# Patient Record
Sex: Female | Born: 1990 | Race: Black or African American | Hispanic: No | Marital: Single | Smoking: Never smoker
Health system: Southern US, Community
[De-identification: ages and names within clinical notes are randomized; demographics above are authoritative.]

## PROBLEM LIST (undated history)

## (undated) DIAGNOSIS — N289 Disorder of kidney and ureter, unspecified: Secondary | ICD-10-CM

## (undated) DIAGNOSIS — N186 End stage renal disease: Secondary | ICD-10-CM

## (undated) DIAGNOSIS — I10 Essential (primary) hypertension: Secondary | ICD-10-CM

---

## 2014-11-13 HISTORY — PX: DG AV DIALYSIS  SHUNT ACCESS EXIST*L* OR: HXRAD910

## 2015-12-21 ENCOUNTER — Encounter (HOSPITAL_COMMUNITY): Payer: Self-pay | Admitting: Emergency Medicine

## 2015-12-21 ENCOUNTER — Emergency Department (HOSPITAL_COMMUNITY)
Admission: EM | Admit: 2015-12-21 | Discharge: 2015-12-21 | Disposition: A | Discharge status: Home / Self Care | Payer: Medicare Other | Attending: Emergency Medicine | Admitting: Emergency Medicine

## 2015-12-21 DIAGNOSIS — N898 Other specified noninflammatory disorders of vagina: Secondary | ICD-10-CM | POA: Diagnosis not present

## 2015-12-21 DIAGNOSIS — Z3202 Encounter for pregnancy test, result negative: Secondary | ICD-10-CM | POA: Diagnosis not present

## 2015-12-21 HISTORY — DX: Disorder of kidney and ureter, unspecified: N28.9

## 2015-12-21 LAB — URINALYSIS, ROUTINE W REFLEX MICROSCOPIC
Bilirubin Urine: NEGATIVE
GLUCOSE, UA: 100 mg/dL — AB
KETONES UR: NEGATIVE mg/dL
Nitrite: NEGATIVE
PH: 8.5 — AB (ref 5.0–8.0)
PROTEIN: 100 mg/dL — AB
Specific Gravity, Urine: 1.009 (ref 1.005–1.030)

## 2015-12-21 LAB — I-STAT BETA HCG BLOOD, ED (MC, WL, AP ONLY): I-stat hCG, quantitative: <5 m[IU]/mL (ref <5)

## 2015-12-21 LAB — URINE MICROSCOPIC-ADD ON: BACTERIA UA: NONE SEEN

## 2015-12-21 LAB — POC URINE PREG, ED: Preg Test, Ur: NEGATIVE

## 2015-12-21 MED ORDER — FLUCONAZOLE 150 MG PO TABS
150.0000 mg | ORAL_TABLET | Freq: Every day | ORAL | Status: DC
  Administered 2015-12-21: 150 mg via ORAL
  Filled 2015-12-21: qty 1

## 2015-12-21 NOTE — ED Triage Notes (Signed)
Patient is complaining of vaginal discharge for 3 days.  Denies burning when urinating but has some itching.  Denies bloody discharge.  Patient states she tried over the counter medication for discharge but no success.

## 2015-12-21 NOTE — ED Provider Notes (Signed)
[facility] DEPT Provider Note   CSN: [id number] Arrival date & time: 12/21/15  1321     History   Chief Complaint Chief Complaint  Patient presents with  . Possible Pregnancy  . Vaginal Discharge    HPI Tricia Simmons is a 25 y.o. female who presents with vaginal discharge and for request of a pregnancy test. 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 She states her LMP was 10/10. Her period is normally every 26 days. She is not on birth control and has unprotected sex. She took two home pregnancy tests - one was positive and one was negative. Additionally she is having symptoms of vaginal discharge. She reports associated vaginal itching and soreness. It is the exactly the same as prior yeast infections. She tried Monistat OTC with no relief. Denies fever, chills, abdominal pain, N/V/D, dysuria, vaginal bleeding. She does not want to be tested for STDs.  HPI  Past Medical History:  Diagnosis Date  . Renal disorder     There are no active problems to display for this patient.   Past Surgical History:  Procedure Laterality Date  . DG AV DIALYSIS  SHUNT ACCESS EXIST*L* OR Left 11/13/2014    OB History    Gravida Para Term Preterm AB Living   1         1   SAB TAB Ectopic Multiple Live Births                   Home Medications    Prior to Admission medications   Not on File    Family History No family history on file.  Social History Social History  Substance Use Topics  . Smoking status: Never Smoker  . Smokeless tobacco: Never Used  . Alcohol use No     Allergies   Patient has no allergy information on record.   Review of Systems Review of Systems  Constitutional: Negative for chills and fever.  Respiratory: Negative for shortness of breath.   Cardiovascular: Negative for chest pain.  Gastrointestinal: Negative for abdominal pain, nausea and vomiting.  Genitourinary: Positive for menstrual problem and vaginal discharge.  Negative for dysuria, flank pain, frequency, pelvic pain, vaginal bleeding and vaginal pain.  All other systems reviewed and are negative.    Physical Exam Updated Vital Signs BP (!) 168/109 (BP Location: Right Arm)   Pulse 76   Temp 98.1 F (36.7 C) (Oral)   Resp 16   Ht 5\' 5"  (1.651 m)   Wt 74.8 kg   LMP 11/22/2015 Comment: PATIENT IS UNSURE ABOUT PREGNANCY  SpO2 100%   BMI 27.46 kg/m   Physical Exam  Constitutional: She is oriented to person, place, and time. She appears well-developed and well-nourished. No distress.  HENT:  Head: Normocephalic and atraumatic.  Eyes: Conjunctivae are normal. Pupils are equal, round, and reactive to light. Right eye exhibits no discharge. Left eye exhibits no discharge. No scleral icterus.  Neck: Normal range of motion. Neck supple.  Cardiovascular: Normal rate and regular rhythm.  Exam reveals no gallop and no friction rub.   Murmur heard. Soft systolic murmur  Pulmonary/Chest: Effort normal and breath sounds normal. No respiratory distress. She has no wheezes. She has no rales. She exhibits no tenderness.  Abdominal: Soft. Bowel sounds are normal. She exhibits no distension and no mass. There is no tenderness. There is no rebound and no guarding. No hernia.  Genitourinary:  Genitourinary Comments: Pelvic exam: Pt refused  Musculoskeletal:  She exhibits no edema.  Neurological: She is alert and oriented to person, place, and time.  Skin: Skin is warm and dry.  L AV fistula thrill palpated  Psychiatric: She has a normal mood and affect. Her behavior is normal.  Nursing note and vitals reviewed.    ED Treatments / Results  Labs (all labs ordered are listed, but only abnormal results are displayed) Labs Reviewed  URINALYSIS, ROUTINE W REFLEX MICROSCOPIC (NOT AT [facility]) - Abnormal; Notable for the following:       Result Value   APPearance CLOUDY (*)    pH 8.5 (*)    Glucose, UA 100 (*)    Hgb urine dipstick SMALL (*)    Protein, ur  100 (*)    Leukocytes, UA TRACE (*)    All other components within normal limits  URINE MICROSCOPIC-ADD ON - Abnormal; Notable for the following:    Squamous Epithelial / LPF 6-30 (*)    All other components within normal limits  POC URINE PREG, ED  I-STAT BETA HCG BLOOD, ED ([facility], [facility], [facility] ONLY)    EKG  EKG Interpretation None       Radiology No results found.  Procedures Procedures (including critical care time)  Medications Ordered in ED Medications  fluconazole (DIFLUCAN) tablet 150 mg (150 mg Oral Given 12/21/15 1451)     Initial Impression / Assessment and Plan / ED Course  I have reviewed the triage vital signs and the nursing notes.  Pertinent labs & imaging results that were available during my care of the patient were reviewed by me and considered in my medical decision making (see chart for details).  Clinical Course    25 year old female with request for a pregnancy test and vaginal discharge. She is hypertensive - has ESRD and has not taken her meds today. She refuses pelvic exam today. She understands that if her symptoms are not improving she will likely need a pelvic exam for swabs. Urine preg test is neg and I-stat hcg is <5. Patient is NAD, non-toxic, with stable VS. Patient is informed of clinical course, understands medical decision making process, and agrees with plan. Opportunity for questions provided and all questions answered. Return precautions given.   Final Clinical Impressions(s) / ED Diagnoses   Final diagnoses:  Negative pregnancy test  Vaginal discharge    New Prescriptions New Prescriptions   No medications on file     Recardo Evangelist, PA-C 12/21/15 [place], MD 12/22/15 2348

## 2016-02-01 ENCOUNTER — Emergency Department (HOSPITAL_COMMUNITY)
Admission: EM | Admit: 2016-02-01 | Discharge: 2016-02-01 | Disposition: A | Discharge status: Home / Self Care | Payer: Medicare Other | Attending: Emergency Medicine | Admitting: Emergency Medicine

## 2016-02-01 ENCOUNTER — Encounter (HOSPITAL_COMMUNITY): Payer: Self-pay | Admitting: Emergency Medicine

## 2016-02-01 DIAGNOSIS — N898 Other specified noninflammatory disorders of vagina: Secondary | ICD-10-CM | POA: Diagnosis present

## 2016-02-01 MED ORDER — FLUCONAZOLE 150 MG PO TABS: 150.0000 mg | ORAL_TABLET | Freq: Once | ORAL | 0 refills | Status: AC

## 2016-02-01 MED ORDER — FLUCONAZOLE 150 MG PO TABS
150.0000 mg | ORAL_TABLET | Freq: Once | ORAL | Status: AC
  Administered 2016-02-01: 150 mg via ORAL
  Filled 2016-02-01: qty 1

## 2016-02-01 NOTE — ED Provider Notes (Signed)
[facility] DEPT Provider Note   CSN: [id number] Arrival date & time: 02/01/16  2426     History   Chief Complaint Chief Complaint  Patient presents with  . Vaginal Discharge  . Arm Issue    HPI Tricia Simmons is a 25 y.o. female.  Patient presents to the ED with a chief complaint of yeast infection.  She states that she gets recurrent yeast infections.  She reports associated vaginal itching and mild white vaginal discharge.  She reports being in a monogamous relationship, and has no concerns for STD.  She states that the symptoms normally resolve with diflucan, but then return.  She denies any dysuria, abdominal pain, or other related symptoms.  She also states that she has had an ammonia smell from her left armpit.  She denies any rash, mass, or tenderness.   The history is provided by the patient. No language interpreter was used.    Past Medical History:  Diagnosis Date  . Renal disorder     There are no active problems to display for this patient.   Past Surgical History:  Procedure Laterality Date  . DG AV DIALYSIS  SHUNT ACCESS EXIST*L* OR Left 11/13/2014    OB History    Gravida Para Term Preterm AB Living   1         1   SAB TAB Ectopic Multiple Live Births                   Home Medications    Prior to Admission medications   Medication Sig Start Date End Date Taking? Authorizing Provider  amLODipine (NORVASC) 10 MG tablet Take 10 mg by mouth at bedtime. 09/13/15  Yes Historical Provider, MD  calcium acetate (PHOSLO) 667 MG capsule Take 667-1,334 mg by mouth See admin instructions. 2 capsules before a meal and 1 capsule before a snack - up to 9 capsules daily   Yes Historical Provider, MD  labetalol (NORMODYNE) 200 MG tablet Take 200 mg by mouth 2 (two) times daily.   Yes Historical Provider, MD  lisinopril (PRINIVIL,ZESTRIL) 20 MG tablet Take 20 mg by mouth at bedtime. 10/20/15  Yes Historical Provider, MD  SENSIPAR 60 MG tablet Take 60 mg by mouth  daily after supper. 10/21/15  Yes Historical Provider, MD  valACYclovir (VALTREX) 1000 MG tablet Take 1,000 mg by mouth daily. 10/20/15  Yes Historical Provider, MD  fluconazole (DIFLUCAN) 150 MG tablet Take 1 tablet (150 mg total) by mouth once. 02/01/16 02/01/16  Montine Circle, PA-C    Family History No family history on file.  Social History Social History  Substance Use Topics  . Smoking status: Never Smoker  . Smokeless tobacco: Never Used  . Alcohol use No     Allergies   Patient has no known allergies.   Review of Systems Review of Systems  All other systems reviewed and are negative.    Physical Exam Updated Vital Signs BP (!) 152/106 (BP Location: Right Arm)   Pulse 79   Temp 98.1 F (36.7 C) (Oral)   Resp 16   Ht 5\' 5"  (1.651 m)   Wt 74.8 kg   LMP 01/24/2016   SpO2 100%   BMI 27.46 kg/m   Physical Exam  Constitutional: She is oriented to person, place, and time. She appears well-developed and well-nourished.  HENT:  Head: Normocephalic and atraumatic.  Eyes: Conjunctivae and EOM are normal. Pupils are equal, round, and reactive to light.  Neck: Normal range of motion.  Neck supple.  Cardiovascular: Normal rate and regular rhythm.  Exam reveals no gallop and no friction rub.   No murmur heard. AV fistula in left forearm with thrill  Pulmonary/Chest: Effort normal and breath sounds normal. No respiratory distress. She has no wheezes. She has no rales. She exhibits no tenderness.  Abdominal: Soft. Bowel sounds are normal. She exhibits no distension and no mass. There is no tenderness. There is no rebound and no guarding.  Genitourinary:  Genitourinary Comments: Declined pelvic  Musculoskeletal: Normal range of motion. She exhibits no edema or tenderness.  Neurological: She is alert and oriented to person, place, and time.  Skin: Skin is warm and dry.  Normal left axilla  Psychiatric: She has a normal mood and affect. Her behavior is normal. Judgment and  thought content normal.  Nursing note and vitals reviewed.    ED Treatments / Results  Labs (all labs ordered are listed, but only abnormal results are displayed) Labs Reviewed - No data to display  EKG  EKG Interpretation None       Radiology No results found.  Procedures Procedures (including critical care time)  Medications Ordered in ED Medications  fluconazole (DIFLUCAN) tablet 150 mg (not administered)     Initial Impression / Assessment and Plan / ED Course  I have reviewed the triage vital signs and the nursing notes.  Pertinent labs & imaging results that were available during my care of the patient were reviewed by me and considered in my medical decision making (see chart for details).  Clinical Course     Patient with recurrent vaginal itching, she is convinced that it is yeast.  I advised her to have swabs and pelvic exam done today to confirm, but she declined this.    I have advised her to follow-up with OBGYN regarding vaginal itching.  Recommend F/u with PCP regarding ammonia smell intermittently from left axilla.  Final Clinical Impressions(s) / ED Diagnoses   Final diagnoses:  Vaginal itching    New Prescriptions New Prescriptions   FLUCONAZOLE (DIFLUCAN) 150 MG TABLET    Take 1 tablet (150 mg total) by mouth once.     Montine Circle, PA-C 02/01/16 [address], PA-C 02/01/16 1009    Jola Schmidt, MD 02/02/16 [phone]

## 2016-02-01 NOTE — ED Triage Notes (Signed)
Patient reports recurrent yeast infections. Reports white vaginal discharge x2 weeks. Patient reports she takes dialysis. Reports an "ammonia smell to left arm where the fistula is." The ammonia smell has been going on since August.

## 2016-02-03 ENCOUNTER — Ambulatory Visit (HOSPITAL_COMMUNITY)
Admission: EM | Admit: 2016-02-03 | Discharge: 2016-02-03 | Disposition: A | Discharge status: Home / Self Care | Payer: Medicare Other | Attending: Family Medicine | Admitting: Family Medicine

## 2016-02-03 ENCOUNTER — Encounter (HOSPITAL_COMMUNITY): Payer: Self-pay | Admitting: Emergency Medicine

## 2016-02-03 DIAGNOSIS — R21 Rash and other nonspecific skin eruption: Secondary | ICD-10-CM | POA: Diagnosis not present

## 2016-02-03 HISTORY — DX: Essential (primary) hypertension: I10

## 2016-02-03 MED ORDER — CLOTRIMAZOLE-BETAMETHASONE 1-0.05 % EX CREA: TOPICAL_CREAM | CUTANEOUS | 0 refills | Status: DC

## 2016-02-03 NOTE — ED Provider Notes (Signed)
[facility]    CSN: [id number] Arrival date & time: 02/03/16  1021     History   Chief Complaint Chief Complaint  Patient presents with  . Vascular Access Problem    odor in access arm    HPI Tricia Simmons is a 25 y.o. female.   PT reports an "ammonia" odor from left axilla that started in August. PT dialyzes MWF and her access is in left arm. PT has spoken to her nephrologist about the odor, but it has not been pursued. PT is not going to dialyze today, she is going to wait until Sunday. Her nephrologist is aware of the break in dialysis. PT reports she does produce urine. PT is hypertensive and just took her meds thirty minutes ago.      Past Medical History:  Diagnosis Date  . Hypertension   . Renal disorder     There are no active problems to display for this patient.   Past Surgical History:  Procedure Laterality Date  . DG AV DIALYSIS  SHUNT ACCESS EXIST*L* OR Left 11/13/2014    OB History    Gravida Para Term Preterm AB Living   1         1   SAB TAB Ectopic Multiple Live Births                   Home Medications    Prior to Admission medications   Medication Sig Start Date End Date Taking? Authorizing Provider  amLODipine (NORVASC) 10 MG tablet Take 10 mg by mouth at bedtime. 09/13/15   Historical Provider, MD  calcium acetate (PHOSLO) 667 MG capsule Take 667-1,334 mg by mouth See admin instructions. 2 capsules before a meal and 1 capsule before a snack - up to 9 capsules daily    Historical Provider, MD  clotrimazole-betamethasone (LOTRISONE) cream Apply to affected area 2 times daily prn 02/03/16   Robyn Haber, MD  labetalol (NORMODYNE) 200 MG tablet Take 200 mg by mouth 2 (two) times daily.    Historical Provider, MD  lisinopril (PRINIVIL,ZESTRIL) 20 MG tablet Take 20 mg by mouth at bedtime. 10/20/15   Historical Provider, MD  SENSIPAR 60 MG tablet Take 60 mg by mouth daily after supper. 10/21/15   Historical Provider, MD  valACYclovir  (VALTREX) 1000 MG tablet Take 1,000 mg by mouth daily. 10/20/15   Historical Provider, MD    Family History No family history on file.  Social History Social History  Substance Use Topics  . Smoking status: Never Smoker  . Smokeless tobacco: Never Used  . Alcohol use No     Allergies   Patient has no known allergies.   Review of Systems Review of Systems  Constitutional: Negative.   HENT: Negative.   Respiratory: Negative.   Skin: Negative for color change, rash and wound.     Physical Exam Triage Vital Signs ED Triage Vitals  Enc Vitals Group     BP 02/03/16 1105 (!) 152/105     Pulse Rate 02/03/16 1105 71     Resp 02/03/16 1105 16     Temp 02/03/16 1105 97.6 F (36.4 C)     Temp Source 02/03/16 1105 Oral     SpO2 02/03/16 1105 100 %     Weight 02/03/16 1106 164 lb (74.4 kg)     Height 02/03/16 1106 5\' 5"  (1.651 m)     Head Circumference --      Peak Flow --  Pain Score 02/03/16 1107 0     Pain Loc --      Pain Edu? --      Excl. in [place]? --    No data found.   Updated Vital Signs BP (!) 152/105   Pulse 71   Temp 97.6 F (36.4 C) (Oral)   Resp 16   Ht 5\' 5"  (1.651 m)   Wt 164 lb (74.4 kg)   LMP 01/24/2016   SpO2 100%   BMI 27.29 kg/m    Physical Exam  Constitutional: She is oriented to person, place, and time. She appears well-developed and well-nourished.  HENT:  Head: Normocephalic.  Right Ear: External ear normal.  Left Ear: External ear normal.  Mouth/Throat: Oropharynx is clear and moist.  Eyes: Conjunctivae and EOM are normal. Pupils are equal, round, and reactive to light.  Neck: Normal range of motion. Neck supple.  Musculoskeletal: Normal range of motion.  Vascular access on left forearm, thrill palpated  Neurological: She is oriented to person, place, and time.  Skin: Skin is warm and dry.  Patient has some mild, fine papular changes in the left axilla with some mild hyperpigmentation  Psychiatric: She has a normal mood and  affect. Her behavior is normal.  Nursing note and vitals reviewed.    UC Treatments / Results  Labs (all labs ordered are listed, but only abnormal results are displayed) Labs Reviewed - No data to display  EKG  EKG Interpretation None       Radiology No results found.  Procedures Procedures (including critical care time)  Medications Ordered in UC Medications - No data to display   Initial Impression / Assessment and Plan / UC Course  I have reviewed the triage vital signs and the nursing notes.  Pertinent labs & imaging results that were available during my care of the patient were reviewed by me and considered in my medical decision making (see chart for details).  Clinical Course     Final Clinical Impressions(s) / UC Diagnoses   Final diagnoses:  Rash    New Prescriptions New Prescriptions   CLOTRIMAZOLE-BETAMETHASONE (LOTRISONE) CREAM    Apply to affected area 2 times daily prn     Robyn Haber, MD 02/03/16 1120

## 2016-02-03 NOTE — ED Triage Notes (Signed)
PT reports an "ammonia" odor from left arm pit that started in August. PT dialyzes MWF and her access is in left arm. PT has spoken to her nephrologist about the odor, but it has not been pursued. PT is not going to dialyze today, she is going to wait until Sunday. Her nephrologist is aware of the break in dialysis. PT reports she does produce urine. PT is hypertensive and just took her meds thirty minutes ago

## 2016-02-03 NOTE — Discharge Instructions (Signed)
Expect improvement over the next week.

## 2016-04-24 ENCOUNTER — Other Ambulatory Visit: Payer: Self-pay

## 2016-04-24 ENCOUNTER — Telehealth: Payer: Self-pay

## 2016-04-24 NOTE — Telephone Encounter (Signed)
Phone call from the [facility] with request from Dr. Hollie Salk to schedule for Fistulogram left AVF; pt. has low access flow through AVF.  Fistulogram scheduled for 04/26/16 @ 8:30 AM @ [facility].  Will fax instructions to the kidney center for pt.

## 2016-04-26 ENCOUNTER — Encounter (HOSPITAL_COMMUNITY): Payer: Self-pay | Admitting: Vascular Surgery

## 2016-04-26 ENCOUNTER — Encounter (HOSPITAL_COMMUNITY)
Admission: RE | Disposition: A | Discharge status: Home / Self Care | Payer: Self-pay | Source: Ambulatory Visit | Attending: Vascular Surgery

## 2016-04-26 ENCOUNTER — Ambulatory Visit (HOSPITAL_COMMUNITY)
Admission: RE | Admit: 2016-04-26 | Discharge: 2016-04-26 | Disposition: A | Discharge status: Home / Self Care | Payer: Medicare Other | Source: Ambulatory Visit | Attending: Vascular Surgery | Admitting: Vascular Surgery

## 2016-04-26 ENCOUNTER — Telehealth: Payer: Self-pay | Admitting: Vascular Surgery

## 2016-04-26 DIAGNOSIS — N186 End stage renal disease: Secondary | ICD-10-CM | POA: Insufficient documentation

## 2016-04-26 DIAGNOSIS — T82858A Stenosis of vascular prosthetic devices, implants and grafts, initial encounter: Secondary | ICD-10-CM | POA: Insufficient documentation

## 2016-04-26 DIAGNOSIS — Y832 Surgical operation with anastomosis, bypass or graft as the cause of abnormal reaction of the patient, or of later complication, without mention of misadventure at the time of the procedure: Secondary | ICD-10-CM | POA: Diagnosis not present

## 2016-04-26 DIAGNOSIS — I120 Hypertensive chronic kidney disease with stage 5 chronic kidney disease or end stage renal disease: Secondary | ICD-10-CM | POA: Insufficient documentation

## 2016-04-26 DIAGNOSIS — Z992 Dependence on renal dialysis: Secondary | ICD-10-CM

## 2016-04-26 HISTORY — PX: PERIPHERAL VASCULAR BALLOON ANGIOPLASTY: CATH118281

## 2016-04-26 HISTORY — PX: A/V FISTULAGRAM: CATH118298

## 2016-04-26 LAB — POCT I-STAT, CHEM 8
BUN: 33 mg/dL — ABNORMAL HIGH (ref 6–20)
CHLORIDE: 97 mmol/L — AB (ref 101–111)
CREATININE: 7.6 mg/dL — AB (ref 0.44–1.00)
Calcium, Ion: 1.01 mmol/L — ABNORMAL LOW (ref 1.15–1.40)
GLUCOSE: 84 mg/dL (ref 65–99)
HEMATOCRIT: 32 % — AB (ref 36.0–46.0)
HEMOGLOBIN: 10.9 g/dL — AB (ref 12.0–15.0)
Potassium: 5 mmol/L (ref 3.5–5.1)
Sodium: 138 mmol/L (ref 135–145)
TCO2: 34 mmol/L (ref 0–100)

## 2016-04-26 LAB — HCG, SERUM, QUALITATIVE: Preg, Serum: NEGATIVE

## 2016-04-26 SURGERY — A/V FISTULAGRAM
Anesthesia: LOCAL | Laterality: Left

## 2016-04-26 MED ORDER — FENTANYL CITRATE (PF) 100 MCG/2ML IJ SOLN
INTRAMUSCULAR | Status: DC | PRN
  Administered 2016-04-26 (×2): 50 ug via INTRAVENOUS

## 2016-04-26 MED ORDER — SODIUM CHLORIDE 0.9% FLUSH: 3.0000 mL | INTRAVENOUS | Status: DC | PRN

## 2016-04-26 MED ORDER — LIDOCAINE HCL (PF) 1 % IJ SOLN
INTRAMUSCULAR | Status: AC
  Filled 2016-04-26: qty 30

## 2016-04-26 MED ORDER — HEPARIN SODIUM (PORCINE) 1000 UNIT/ML IJ SOLN
INTRAMUSCULAR | Status: AC
  Filled 2016-04-26: qty 1

## 2016-04-26 MED ORDER — ACETAMINOPHEN 325 MG PO TABS: 650.0000 mg | ORAL_TABLET | ORAL | Status: DC | PRN

## 2016-04-26 MED ORDER — HEPARIN (PORCINE) IN NACL 2-0.9 UNIT/ML-% IJ SOLN
INTRAMUSCULAR | Status: DC | PRN
  Administered 2016-04-26: 1000 mL

## 2016-04-26 MED ORDER — FENTANYL CITRATE (PF) 100 MCG/2ML IJ SOLN
INTRAMUSCULAR | Status: AC
  Filled 2016-04-26: qty 2

## 2016-04-26 MED ORDER — MIDAZOLAM HCL 2 MG/2ML IJ SOLN
INTRAMUSCULAR | Status: DC | PRN
  Administered 2016-04-26: 1 mg via INTRAVENOUS

## 2016-04-26 MED ORDER — LIDOCAINE HCL (PF) 1 % IJ SOLN
INTRAMUSCULAR | Status: DC | PRN
  Administered 2016-04-26: 2 mL via SUBCUTANEOUS

## 2016-04-26 MED ORDER — HEPARIN SODIUM (PORCINE) 1000 UNIT/ML IJ SOLN
INTRAMUSCULAR | Status: DC | PRN
  Administered 2016-04-26: 3000 [IU] via INTRAVENOUS

## 2016-04-26 MED ORDER — SODIUM CHLORIDE 0.9 % IV SOLN: 250.0000 mL | INTRAVENOUS | Status: DC | PRN

## 2016-04-26 MED ORDER — IODIXANOL 320 MG/ML IV SOLN
INTRAVENOUS | Status: DC | PRN
  Administered 2016-04-26: 45 mL via INTRAVENOUS

## 2016-04-26 MED ORDER — SODIUM CHLORIDE 0.9% FLUSH: 3.0000 mL | Freq: Two times a day (BID) | INTRAVENOUS | Status: DC

## 2016-04-26 MED ORDER — MIDAZOLAM HCL 2 MG/2ML IJ SOLN
INTRAMUSCULAR | Status: AC
  Filled 2016-04-26: qty 2

## 2016-04-26 SURGICAL SUPPLY — 17 items
BAG SNAP BAND KOVER 36X36 (MISCELLANEOUS) ×2 IMPLANT
BALLN ANGIOSCULPT 6X40 (BALLOONS) ×2
BALLOON ANGIOSCULPT 6X40 (BALLOONS) ×1 IMPLANT
CATH ANGIO 5F BER2 65CM (CATHETERS) ×2 IMPLANT
COVER DOME SNAP 22 D (MISCELLANEOUS) ×2 IMPLANT
COVER PRB 48X5XTLSCP FOLD TPE (BAG) ×1 IMPLANT
COVER PROBE 5X48 (BAG) ×1
KIT ENCORE 26 ADVANTAGE (KITS) ×2 IMPLANT
PROTECTION STATION PRESSURIZED (MISCELLANEOUS) ×2
SHEATH PINNACLE R/O II 6F 4CM (SHEATH) ×2 IMPLANT
STATION PROTECTION PRESSURIZED (MISCELLANEOUS) ×1 IMPLANT
STOPCOCK MORSE 400PSI 3WAY (MISCELLANEOUS) ×2 IMPLANT
TRAY PV CATH (CUSTOM PROCEDURE TRAY) ×2 IMPLANT
TUBING CIL FLEX 10 FLL-RA (TUBING) ×2 IMPLANT
WIRE BENTSON .035X145CM (WIRE) ×2 IMPLANT
WIRE MINI STICK MAX (SHEATH) ×2 IMPLANT
WIRE SPARTACORE .014X300CM (WIRE) ×2 IMPLANT

## 2016-04-26 NOTE — H&P (Signed)
Brief History and Physical  History of Present Illness  Tricia Simmons is a 26 y.o. female who presents with chief complaint: poor flow rates in left radiocephalic arteriovenous fistula.  The patient presents today for L arm fistulogram possible intervention. The patient denies any steal sx.  She notes the flow rates have drop from 900 mL/min.    Past Medical History:  Diagnosis Date  . Hypertension   . Renal disorder     Past Surgical History:  Procedure Laterality Date  . DG AV DIALYSIS  SHUNT ACCESS EXIST*L* OR Left 11/13/2014    Social History   Social History  . Marital status: Single    Spouse name: N/A  . Number of children: N/A  . Years of education: N/A   Occupational History  . Not on file.   Social History Main Topics  . Smoking status: Never Smoker  . Smokeless tobacco: Never Used  . Alcohol use No  . Drug use: No  . Sexual activity: Yes   Other Topics Concern  . Not on file   Social History Narrative  . No narrative on file    No family history on file.  No current facility-administered medications on file prior to encounter.    Current Outpatient Prescriptions on File Prior to Encounter  Medication Sig Dispense Refill  . amLODipine (NORVASC) 10 MG tablet Take 10 mg by mouth at bedtime.  4  . calcium acetate (PHOSLO) 667 MG capsule Take 667-1,334 mg by mouth See admin instructions. 2 capsules before a meal and 1 capsule before a snack - up to 9 capsules daily    . labetalol (NORMODYNE) 200 MG tablet Take 400 mg by mouth 2 (two) times daily.     Marland Kitchen lisinopril (PRINIVIL,ZESTRIL) 20 MG tablet Take 20 mg by mouth at bedtime.  3  . SENSIPAR 60 MG tablet Take 60 mg by mouth daily after supper.  5  . valACYclovir (VALTREX) 1000 MG tablet Take 1,000 mg by mouth daily.  0    No Known Allergies  Review of Systems: no steal sx, ESRD: HD-M/W/F  Physical Examination  Vitals:   04/26/16 0831  BP: 129/78  Pulse: 68  Resp: 18  Temp: 98.2 F (36.8 C)   TempSrc: Oral  Weight: 160 lb 15 oz (73 kg)  Height: 5\' 5"  (1.651 m)    General: A&O x 3, WDWN  Pulmonary: Sym exp, good air movt, good air movement, no audible wheezing  Cardiac: RRR, Nl S1, S2,   Musculoskeletal: M/S 5/5 throughout grossly, Extremities without ischemic changes , palpable thrill in left forearm arteriovenous arteriovenous fistula, incision c/w left radiocephalic arteriovenous fistula, +bruit without significant outflow sounds  Laboratory See [place] is a 26 y.o. 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   The patient is scheduled for: Left arm fistulogram, possible intervention. I discussed with the patient the nature of angiographic procedures, especially the limited patencies of any endovascular intervention.  The patient is aware of that the risks of an angiographic procedure include but are not limited to: bleeding, infection, access site complications, renal failure, embolization, rupture of vessel, dissection, possible need for emergent surgical intervention, possible need for surgical procedures to treat the patient's pathology, and stroke and death.    The patient is aware of the risks and agrees to proceed.  Adele Barthel, MD [facility] Office: [phone] Pager:  [phone]  04/26/2016, 10:10 AM

## 2016-04-26 NOTE — Telephone Encounter (Signed)
Scheduled 08/10/16 @ 11 am.

## 2016-04-26 NOTE — Telephone Encounter (Signed)
-----   Message from Mena Goes, RN sent at 04/26/2016 11:56 AM EDT ----- Regarding: 3 months w/ duplex   ----- Message ----- From: Conrad Cimarron, MD Sent: 04/26/2016  11:51 AM To: [facility] Charge Pool  Marche Hottenstein [id number] 1990-12-07  PROCEDURE: 1.  left radiocephalic arteriovenous fistula cannulation under ultrasound guidance 2.  left arm shuntogram 3.  Venoplasty of cephalic vein (Angiosculpt 6 mm x 40 mm) 4.  Conscious sedation 25 minutes  Follow-up: 3 months  Orders(s) for follow-up: L access duplex

## 2016-04-26 NOTE — Discharge Instructions (Signed)
Fistulogram, Care After °Refer to this sheet in the next few weeks. These instructions provide you with information on caring for yourself after your procedure. Your health care provider may also give you more specific instructions. Your treatment has been planned according to current medical practices, but problems sometimes occur. Call your health care provider if you have any problems or questions after your procedure. °What can I expect after the procedure? °After your procedure, it is typical to have the following: °· A small amount of discomfort in the area where the catheters were placed. °· A small amount of bruising around the fistula. °· Sleepiness and fatigue. °Follow these instructions at home: °· Rest at home for the day following your procedure. °· Do not drive or operate heavy machinery while taking pain medicine. °· Take medicines only as directed by your health care provider. °· Do not take baths, swim, or use a hot tub until your health care provider approves. You may shower 24 hours after the procedure or as directed by your health care provider. °· There are many different ways to close and cover an incision, including stitches, skin glue, and adhesive strips. Follow your health care provider's instructions on: °¨ Incision care. °¨ Bandage (dressing) changes and removal. °¨ Incision closure removal. °· Monitor your dialysis fistula carefully. °Contact a health care provider if: °· You have drainage, redness, swelling, or pain at your catheter site. °· You have a fever. °· You have chills. °Get help right away if: °· You feel weak. °· You have trouble balancing. °· You have trouble moving your arms or legs. °· You have problems with your speech or vision. °· You can no longer feel a vibration or buzz when you put your fingers over your dialysis fistula. °· The limb that was used for the procedure: °¨ Swells. °¨ Is painful. °¨ Is cold. °¨ Is discolored, such as blue or pale white. °This information  is not intended to replace advice given to you by your health care provider. Make sure you discuss any questions you have with your health care provider. °Document Released: 06/15/2013 Document Revised: 07/07/2015 Document Reviewed: 03/20/2013 °Elsevier Interactive Patient Education © 2017 [employer] ° °

## 2016-04-26 NOTE — Op Note (Addendum)
OPERATIVE NOTE   PROCEDURE: 1.  left radiocephalic arteriovenous fistula cannulation under ultrasound guidance 2.  left arm shuntogram 3.  Venoplasty of cephalic vein (Angiosculpt 6 mm x 40 mm) 4.  Conscious sedation 25 minutes   PRE-OPERATIVE DIAGNOSIS: left radiocephalic arteriovenous fistula with known venous stenosis    POST-OPERATIVE DIAGNOSIS: same as above    SURGEON: Adele Barthel, MD   ANESTHESIA: local   ESTIMATED BLOOD LOSS: 50 cc   FINDING(S): 1. Recurrent antecubital segment venous stenosis: 3 mm to 5 mm with venoplasty 2. Residual waist in cephalic vein visible event with burst pressure inflation  Patent left radiocephalic arteriovenous fistula  Widely patent central vein Forearm cephalic vein drains primarily through cubital vein into basilic vein   SPECIMEN(S):  None  CONTRAST: 45 cc  INDICATIONS: Tricia Simmons is a 26 y.o. female who presents with left radiocephalic arteriovenous fistula with poor flow rates.  The patient is scheduled for left arm shuntogram.  The patient is aware of that the risks of an angiographic procedure include but are not limited to: bleeding, infection, access site complications, thrombosis of access, renal failure, embolization, rupture of vessel, dissection, arteriovenous fistula, possible need for emergent surgical intervention, possible need for surgical procedures to treat the patient's pathology, anaphylactic reaction to contrast, and stroke and death.  The patient is aware of the risks of the procedure and elects to proceed forward.   DESCRIPTION: After full informed written consent was obtained, the patient was brought back to the angiography suite and placed supine upon the angiography table.  The patient was connected to monitoring equipment.  The left forearm was prepped and draped in the standard fashion for a percutaneous access intervention.  Under ultrasound guidance, the left radiocephalic arteriovenous fistula was  cannulated with a micropuncture needle.  The microwire was advanced into the fistula and the needle was exchanged for the a microsheath, which was lodged 2 cm into the access.  The wire was removed and the sheath was connected to the IV extension tubing.  Hand injections were completed to image the access from the cannulation site up the right atrium.  Findings are listed above.  Based on the images, this patient will need: attempt at cutting venoplasty of the antecubital vein segment.  A Bentson wire was advanced into the basilic vein and the sheath was exchanged for a short 6-Fr sheath.  The patient was given 3000 units of heparin for some anticoagulation.  The patient was given 1 mg of Versed and 50 mcg of Fentanyl for conscious sedation.  She was already on cardiopulmonary monitoring and her monitoring was completed by both a circulating technician and control room technician per protocol.  Based on the imaging, a 6 mm x 40 mm Angiosculpt angioplasty balloon was selected.  The balloon was centered around the antecubital stenosis and inflated to 10 ATM for 2 minutes.  On completion imaging, a >30% residual stenosis was present.  At this point, the same balloon was replaced and centered on the stenosis .  The balloon was centered around the stenosis and inflated to 14 ATM for 3 minutes.  Even with maximal burst pressure, there remained some waist in the vein.  On completion imaging, a <30% residual stenosis (3 mm to 5 mm)was present.  Based on the completion imaging, no further intervention is necessary.  The wire and balloon were removed from the sheath.  A 4-0 Monocryl purse-string suture was sewn around the sheath.  The sheath was removed while  tying down the suture.  A sterile bandage was applied to the puncture site.  In the event of recurrent stenosis, I do NOT recommend repeat angioplasty, rather I recommend revision of the antecubital segment of this fistula: resection of sclerotic segment and  reanastomosis of forearm cephalic vein to cubital vein.   COMPLICATIONS: none   CONDITION: stable   Adele Barthel, MD, [facility] [place] Office: [phone] Pager: [phone]  04/26/2016 11:44 AM

## 2016-04-27 ENCOUNTER — Other Ambulatory Visit: Payer: Self-pay

## 2016-04-27 DIAGNOSIS — N186 End stage renal disease: Secondary | ICD-10-CM

## 2016-04-27 DIAGNOSIS — Z992 Dependence on renal dialysis: Principal | ICD-10-CM

## 2016-05-23 ENCOUNTER — Encounter (HOSPITAL_COMMUNITY): Payer: Self-pay | Admitting: Emergency Medicine

## 2016-05-23 ENCOUNTER — Ambulatory Visit (HOSPITAL_COMMUNITY)
Admission: EM | Admit: 2016-05-23 | Discharge: 2016-05-23 | Disposition: A | Discharge status: Home / Self Care | Payer: Medicare Other | Attending: Internal Medicine | Admitting: Internal Medicine

## 2016-05-23 DIAGNOSIS — I10 Essential (primary) hypertension: Secondary | ICD-10-CM | POA: Diagnosis not present

## 2016-05-23 DIAGNOSIS — Z3202 Encounter for pregnancy test, result negative: Secondary | ICD-10-CM

## 2016-05-23 DIAGNOSIS — B9689 Other specified bacterial agents as the cause of diseases classified elsewhere: Secondary | ICD-10-CM

## 2016-05-23 DIAGNOSIS — N760 Acute vaginitis: Secondary | ICD-10-CM | POA: Diagnosis not present

## 2016-05-23 DIAGNOSIS — G43909 Migraine, unspecified, not intractable, without status migrainosus: Secondary | ICD-10-CM | POA: Insufficient documentation

## 2016-05-23 DIAGNOSIS — H538 Other visual disturbances: Secondary | ICD-10-CM | POA: Diagnosis present

## 2016-05-23 DIAGNOSIS — G43109 Migraine with aura, not intractable, without status migrainosus: Secondary | ICD-10-CM

## 2016-05-23 LAB — POCT PREGNANCY, URINE: Preg Test, Ur: NEGATIVE

## 2016-05-23 MED ORDER — METRONIDAZOLE 500 MG PO TABS: 500.0000 mg | ORAL_TABLET | Freq: Two times a day (BID) | ORAL | 0 refills | Status: DC

## 2016-05-23 NOTE — ED Provider Notes (Signed)
CSN: [id number]     Arrival date & time 05/23/16  1846 History   None    Chief Complaint  Patient presents with  . Blurred Vision  . SEXUALLY TRANSMITTED DISEASE   (Consider location/radiation/quality/duration/timing/severity/associated sxs/prior Treatment) 26 year old female presents to clinic tonight with 2 chief complaints, the first is blurred vision, onset of 24 hours ago, she denies any eye pain, no eye itching, no eye redness, no discharge, no flashing lights, no sensation of a shade dropping over her eyes. She does not see an eye doctor, does not know what her baseline vision is.   She is also presenting requesting STD checking. States she was seen recently, diagnosed with activity. Vaginosis, and treated with a type of intravaginal suppository. She states that she is having a discharge again, and once to rule out other causes of infection. She denies any pelvic pain, denies any pain with intercourse, denies fever, chills, dysuria, flank pain, or other physical complaints. She is currently on her period, denies possibility of pregnancy, she is sexually active, does not use birth control, and does not use protection.   The history is provided by the patient.    Past Medical History:  Diagnosis Date  . Hypertension   . Renal disorder    Past Surgical History:  Procedure Laterality Date  . A/V SHUNTOGRAM Left 04/26/2016   Procedure: A/V Fistulagram;  Surgeon: Conrad Buffalo, MD;  Location: [facility];  Service: Cardiovascular;  Laterality: Left;  . DG AV DIALYSIS  SHUNT ACCESS EXIST*L* OR Left 11/13/2014  . PERIPHERAL VASCULAR BALLOON ANGIOPLASTY Left 04/26/2016   Procedure: Peripheral Vascular Balloon Angioplasty;  Surgeon: Conrad Millingport, MD;  Location: [facility];  Service: Cardiovascular;  Laterality: Left;  Arm Fistula   History reviewed. No pertinent family history. Social History  Substance Use Topics  . Smoking status: Never Smoker  . Smokeless tobacco: Never  Used  . Alcohol use No   OB History    Gravida Para Term Preterm AB Living   1         1   SAB TAB Ectopic Multiple Live Births                 Review of Systems  Constitutional: Negative for chills and fever.  HENT: Negative for congestion, sinus pain and sinus pressure.   Eyes: Negative for photophobia, pain, discharge, redness, itching and visual disturbance.  Respiratory: Negative for cough and shortness of breath.   Cardiovascular: Negative for chest pain and palpitations.  Gastrointestinal: Negative for diarrhea, nausea and vomiting.  Genitourinary: Positive for vaginal discharge. Negative for dyspareunia, dysuria, flank pain, frequency, menstrual problem, pelvic pain, urgency and vaginal pain.  Musculoskeletal: Negative for back pain, neck pain and neck stiffness.  Skin: Negative for color change and pallor.  Neurological: Negative for dizziness, syncope, facial asymmetry, weakness, light-headedness, numbness and headaches.    Allergies  Patient has no known allergies.  Home Medications   Prior to Admission medications   Medication Sig Start Date End Date Taking? Authorizing Provider  amLODipine (NORVASC) 10 MG tablet Take 10 mg by mouth at bedtime. 09/13/15   Historical Provider, MD  calcium acetate (PHOSLO) 667 MG capsule Take 667-1,334 mg by mouth See admin instructions. 2 capsules before a meal and 1 capsule before a snack - up to 9 capsules daily    Historical Provider, MD  labetalol (NORMODYNE) 200 MG tablet Take 400 mg by mouth 2 (two) times daily.  Historical Provider, MD  lisinopril (PRINIVIL,ZESTRIL) 20 MG tablet Take 20 mg by mouth at bedtime. 10/20/15   Historical Provider, MD  metroNIDAZOLE (FLAGYL) 500 MG tablet Take 1 tablet (500 mg total) by mouth 2 (two) times daily. 05/23/16   Barnet Glasgow, NP  SENSIPAR 60 MG tablet Take 60 mg by mouth daily after supper. 10/21/15   Historical Provider, MD  valACYclovir (VALTREX) 1000 MG tablet Take 1,000 mg by mouth  daily. 10/20/15   Historical Provider, MD   Meds Ordered and Administered this Visit  Medications - No data to display  BP 130/78 (BP Location: Right Arm)   Pulse 76   Temp 99.1 F (37.3 C) (Oral)   Resp 18   SpO2 100%  No data found.   Physical Exam  Constitutional: She is oriented to person, place, and time. She appears well-developed and well-nourished. No distress.  HENT:  Head: Normocephalic and atraumatic.  Right Ear: External ear normal.  Left Ear: External ear normal.  Eyes: Conjunctivae, EOM and lids are normal. Pupils are equal, round, and reactive to light. Right eye exhibits no discharge and no exudate. Left eye exhibits no discharge and no exudate. Right conjunctiva is not injected. Right conjunctiva has no hemorrhage. Left conjunctiva is not injected. Left conjunctiva has no hemorrhage. Right eye exhibits normal extraocular motion and no nystagmus. Left eye exhibits normal extraocular motion and no nystagmus.  Fundoscopic exam:      The right eye shows no exudate and no hemorrhage. The right eye shows red reflex.       The left eye shows no exudate and no hemorrhage. The left eye shows red reflex.  Neck: Normal range of motion.  Cardiovascular: Normal rate and regular rhythm.   Pulmonary/Chest: Effort normal and breath sounds normal.  Abdominal: Soft. Bowel sounds are normal. She exhibits no distension. There is no tenderness. There is no guarding.  Genitourinary: Uterus normal. Pelvic exam was performed with patient supine. There is no tenderness or lesion on the right labia. There is no tenderness or lesion on the left labia. Cervix exhibits discharge. Cervix exhibits no motion tenderness and no friability. Right adnexum displays no mass and no tenderness. Left adnexum displays no mass and no tenderness. No erythema, tenderness or bleeding in the vagina. No foreign body in the vagina. Vaginal discharge found.  Lymphadenopathy:       Right: No inguinal adenopathy present.        Left: No inguinal adenopathy present.  Neurological: She is alert and oriented to person, place, and time.  Skin: Skin is warm and dry. Capillary refill takes less than 2 seconds. She is not diaphoretic.  Psychiatric: She has a normal mood and affect. Her behavior is normal.  Nursing note and vitals reviewed.   Urgent Care Course     Procedures (including critical care time)  Labs Review Labs Reviewed  POCT PREGNANCY, URINE  CERVICOVAGINAL ANCILLARY ONLY    Imaging Review No results found.   Visual Acuity Review  Right Eye Distance: 20/70 (with corrective lenses) Left Eye Distance: 20/100 (with corrective lenses) Bilateral Distance: 20/100 (with corrective lenses)  Right Eye Near:   Left Eye Near:    Bilateral Near:         MDM   1. BV (bacterial vaginosis)   2. Ocular migraine    Prescription for metronidazole sent to the pharmacy to cover for bacterial vaginosis, patient declined treatment for STDs presumptively. We'll notify patient results of her tests 3-5 business days  if positive.  With regard to her eye exam, no immediate threat to life or vision seen on exam, her eyes and peripheral fields examined by confrontation, peripheral vision remains intact. No abnormalities were seen on the exam, possibility for ocular migraine, patient declined treatment tonight. Recommended she follow up with an ophthalmologist in 1-2 days if her symptoms persist, or go to the emergency room at any time her symptoms worsen.     Barnet Glasgow, NP 05/23/16 2052

## 2016-05-23 NOTE — Discharge Instructions (Signed)
With regard to your vaginal discharge, you're being treated for bacterial vaginosis, and you're being tested for gonorrhea, chlamydia, yeast, and Trichomonas. If positive for any of these, you will be notified in 3-5 business days. Follow-up with Public health or return to clinic if your symptoms persist past 7 days, also use a barrier method such as condoms or abstinence for the next 7 days.  For your blurred vision, I do not see any immediate threats to vision, or life, on exam. If symptoms could be consistent with an ocular migraine, or another chronic condition that will need evaluation by ophthalmology. I provided the name of an ophthalmologist, I recommend you contact his office in the morning and set up an appointment for an exam. If at any time your vision worsens between now and when you can see him, return to clinic or go to the ER.

## 2016-05-23 NOTE — ED Triage Notes (Signed)
The patient presented to the [facility] with a complaint of blurred vision bilaterally and also requested a STD check...currently no symptoms.

## 2016-05-24 ENCOUNTER — Encounter (HOSPITAL_COMMUNITY): Payer: Self-pay

## 2016-05-24 ENCOUNTER — Encounter (HOSPITAL_COMMUNITY): Payer: Self-pay | Admitting: Emergency Medicine

## 2016-05-24 ENCOUNTER — Emergency Department (HOSPITAL_COMMUNITY): Payer: Medicare Other

## 2016-05-24 ENCOUNTER — Emergency Department (HOSPITAL_COMMUNITY)
Admission: EM | Admit: 2016-05-24 | Discharge: 2016-05-24 | Disposition: A | Discharge status: Home / Self Care | Payer: Medicare Other | Source: Home / Self Care | Attending: Emergency Medicine | Admitting: Emergency Medicine

## 2016-05-24 ENCOUNTER — Emergency Department (HOSPITAL_COMMUNITY)
Admission: EM | Admit: 2016-05-24 | Discharge: 2016-05-25 | Disposition: A | Discharge status: Home / Self Care | Payer: Medicare Other | Attending: Emergency Medicine | Admitting: Emergency Medicine

## 2016-05-24 DIAGNOSIS — H4710 Unspecified papilledema: Secondary | ICD-10-CM

## 2016-05-24 DIAGNOSIS — Z992 Dependence on renal dialysis: Secondary | ICD-10-CM | POA: Insufficient documentation

## 2016-05-24 DIAGNOSIS — H538 Other visual disturbances: Secondary | ICD-10-CM

## 2016-05-24 DIAGNOSIS — Z79899 Other long term (current) drug therapy: Secondary | ICD-10-CM | POA: Insufficient documentation

## 2016-05-24 DIAGNOSIS — I120 Hypertensive chronic kidney disease with stage 5 chronic kidney disease or end stage renal disease: Secondary | ICD-10-CM | POA: Insufficient documentation

## 2016-05-24 DIAGNOSIS — R930 Abnormal findings on diagnostic imaging of skull and head, not elsewhere classified: Secondary | ICD-10-CM | POA: Diagnosis not present

## 2016-05-24 DIAGNOSIS — N186 End stage renal disease: Secondary | ICD-10-CM | POA: Insufficient documentation

## 2016-05-24 LAB — CBC WITH DIFFERENTIAL/PLATELET
Basophils Absolute: 0 10*3/uL (ref 0.0–0.1)
Basophils Relative: 0 %
EOS ABS: 0.6 10*3/uL (ref 0.0–0.7)
Eosinophils Relative: 9 %
HEMATOCRIT: 33.2 % — AB (ref 36.0–46.0)
Hemoglobin: 10.5 g/dL — ABNORMAL LOW (ref 12.0–15.0)
LYMPHS ABS: 1.8 10*3/uL (ref 0.7–4.0)
Lymphocytes Relative: 28 %
MCH: 31.5 pg (ref 26.0–34.0)
MCHC: 31.6 g/dL (ref 30.0–36.0)
MCV: 99.7 fL (ref 78.0–100.0)
MONO ABS: 0.5 10*3/uL (ref 0.1–1.0)
MONOS PCT: 7 %
NEUTROS PCT: 56 %
Neutro Abs: 3.7 10*3/uL (ref 1.7–7.7)
Platelets: 204 10*3/uL (ref 150–400)
RBC: 3.33 MIL/uL — ABNORMAL LOW (ref 3.87–5.11)
RDW: 17.6 % — ABNORMAL HIGH (ref 11.5–15.5)
WBC: 6.6 10*3/uL (ref 4.0–10.5)

## 2016-05-24 LAB — BASIC METABOLIC PANEL
Anion gap: 10 (ref 5–15)
BUN: 20 mg/dL (ref 6–20)
CHLORIDE: 95 mmol/L — AB (ref 101–111)
CO2: 29 mmol/L (ref 22–32)
CREATININE: 8.79 mg/dL — AB (ref 0.44–1.00)
Calcium: 9.3 mg/dL (ref 8.9–10.3)
GFR calc Af Amer: 7 mL/min — ABNORMAL LOW (ref >60)
GFR calc non Af Amer: 6 mL/min — ABNORMAL LOW (ref >60)
Glucose, Bld: 90 mg/dL (ref 65–99)
Potassium: 4.5 mmol/L (ref 3.5–5.1)
Sodium: 134 mmol/L — ABNORMAL LOW (ref 135–145)

## 2016-05-24 LAB — CSF CELL COUNT WITH DIFFERENTIAL
RBC COUNT CSF: 6 /mm3 — AB
Tube #: 1
WBC CSF: 1 /mm3 (ref 0–5)

## 2016-05-24 MED ORDER — LIDOCAINE HCL 1 % IJ SOLN
INTRAMUSCULAR | Status: AC
  Filled 2016-05-24: qty 20

## 2016-05-24 MED ORDER — LIDOCAINE HCL (PF) 1 % IJ SOLN
30.0000 mL | Freq: Once | INTRAMUSCULAR | Status: AC
  Administered 2016-05-24: 30 mL

## 2016-05-24 MED ORDER — LIDOCAINE HCL (PF) 1 % IJ SOLN
INTRAMUSCULAR | Status: AC
  Administered 2016-05-24: 20 mL via INTRASPINAL
  Filled 2016-05-24: qty 30

## 2016-05-24 NOTE — Discharge Instructions (Signed)
Follow with your eye doctor. Return for any other neurologic symptoms such as difficulty with speech or walking dizziness difficulty swallowing weakness on one side of the body.

## 2016-05-24 NOTE — ED Notes (Signed)
Pt in MRI.

## 2016-05-24 NOTE — ED Notes (Signed)
Patient transported to MRI 

## 2016-05-24 NOTE — ED Provider Notes (Signed)
[facility] DEPT Provider Note   CSN: [id number] Arrival date & time: 05/24/16  0756     History   Chief Complaint Chief Complaint  Patient presents with  . Blurred Vision    HPI Tricia Simmons is a 26 y.o. female.  26 yo F with a chief complaint of blurred vision. This been going on for the past 3 or 4 days. She states that she felt those may be normal in the morning and then as she was playing with her daughter outside and then started having trouble seeing things as clearly is normal. She denies double vision. Denies improvement with closing of one eye or the other. Denies head injury denies headache. Denies neck pain. His fevers or chills. Denies difficulty swallowing unilateral weakness or numbness. Denies difficulty talking.   The history is provided by the patient.  Eye Problem   This is a new problem. The current episode started more than 2 days ago. The problem occurs constantly. The problem has not changed since onset.There is a problem in both eyes. There was no injury mechanism. The pain is at a severity of 0/10. The patient is experiencing no pain. There is no history of trauma to the eye. There is no known exposure to pink eye. She does not wear contacts. Associated symptoms include blurred vision. Pertinent negatives include no eye redness, no nausea and no vomiting. She has tried nothing for the symptoms. The treatment provided no relief.    Past Medical History:  Diagnosis Date  . Hypertension   . Renal disorder     Patient Active Problem List   Diagnosis Date Noted  . ESRD on dialysis [facility]) 04/26/2016    Past Surgical History:  Procedure Laterality Date  . A/V SHUNTOGRAM Left 04/26/2016   Procedure: A/V Fistulagram;  Surgeon: Conrad Alma, MD;  Location: [facility];  Service: Cardiovascular;  Laterality: Left;  . DG AV DIALYSIS  SHUNT ACCESS EXIST*L* OR Left 11/13/2014  . PERIPHERAL VASCULAR BALLOON ANGIOPLASTY Left 04/26/2016   Procedure:  Peripheral Vascular Balloon Angioplasty;  Surgeon: Conrad Smithville, MD;  Location: [facility];  Service: Cardiovascular;  Laterality: Left;  Arm Fistula    OB History    Gravida Para Term Preterm AB Living   1         1   SAB TAB Ectopic Multiple Live Births                   Home Medications    Prior to Admission medications   Medication Sig Start Date End Date Taking? Authorizing Provider  amLODipine (NORVASC) 10 MG tablet Take 10 mg by mouth at bedtime. 09/13/15   Historical Provider, MD  calcium acetate (PHOSLO) 667 MG capsule Take 667-1,334 mg by mouth See admin instructions. 2 capsules before a meal and 1 capsule before a snack - up to 9 capsules daily    Historical Provider, MD  labetalol (NORMODYNE) 200 MG tablet Take 400 mg by mouth 2 (two) times daily.     Historical Provider, MD  lisinopril (PRINIVIL,ZESTRIL) 20 MG tablet Take 20 mg by mouth at bedtime. 10/20/15   Historical Provider, MD  metroNIDAZOLE (FLAGYL) 500 MG tablet Take 1 tablet (500 mg total) by mouth 2 (two) times daily. 05/23/16   Barnet Glasgow, NP  SENSIPAR 60 MG tablet Take 60 mg by mouth daily after supper. 10/21/15   Historical Provider, MD  valACYclovir (VALTREX) 1000 MG tablet Take 1,000 mg by mouth daily.  10/20/15   Historical Provider, MD    Family History History reviewed. No pertinent family history.  Social History Social History  Substance Use Topics  . Smoking status: Never Smoker  . Smokeless tobacco: Never Used  . Alcohol use No     Allergies   Patient has no known allergies.   Review of Systems Review of Systems  Constitutional: Negative for chills and fever.  HENT: Negative for congestion and rhinorrhea.   Eyes: Positive for blurred vision and visual disturbance. Negative for redness.  Respiratory: Negative for shortness of breath and wheezing.   Cardiovascular: Negative for chest pain and palpitations.  Gastrointestinal: Negative for nausea and vomiting.  Genitourinary:  Negative for dysuria and urgency.  Musculoskeletal: Negative for arthralgias and myalgias.  Skin: Negative for pallor and wound.  Neurological: Negative for dizziness and headaches.     Physical Exam Updated Vital Signs BP 119/82 (BP Location: Right Arm)   Pulse 70   Temp 99.2 F (37.3 C) (Oral)   Resp 17   Ht 5\' 5"  (1.651 m)   Wt 160 lb (72.6 kg)   LMP 05/18/2016   SpO2 100%   BMI 26.63 kg/m   Physical Exam  Constitutional: She is oriented to person, place, and time. She appears well-developed and well-nourished. No distress.  HENT:  Head: Normocephalic and atraumatic.  Eyes: EOM are normal. Pupils are equal, round, and reactive to light.  Neck: Normal range of motion. Neck supple.  Cardiovascular: Normal rate and regular rhythm.  Exam reveals no gallop and no friction rub.   No murmur heard. Pulmonary/Chest: Effort normal. She has no wheezes. She has no rales.  Abdominal: Soft. She exhibits no distension. There is no tenderness.  Musculoskeletal: She exhibits no edema or tenderness.  Neurological: She is alert and oriented to person, place, and time. She has normal strength. No cranial nerve deficit or sensory deficit. She displays a negative Romberg sign. Coordination and gait normal. GCS eye subscore is 4. GCS verbal subscore is 5. GCS motor subscore is 6. She displays no Babinski's sign on the right side. She displays no Babinski's sign on the left side.  Reflex Scores:      Tricep reflexes are 2+ on the right side and 2+ on the left side.      Bicep reflexes are 2+ on the right side and 2+ on the left side.      Brachioradialis reflexes are 2+ on the right side and 2+ on the left side.      Patellar reflexes are 2+ on the right side and 2+ on the left side.      Achilles reflexes are 2+ on the right side and 2+ on the left side. Skin: Skin is warm and dry. She is not diaphoretic.  Psychiatric: She has a normal mood and affect. Her behavior is normal.  Nursing note and  vitals reviewed.    ED Treatments / Results  Labs (all labs ordered are listed, but only abnormal results are displayed) Labs Reviewed - No data to display  EKG  EKG Interpretation None       Radiology No results found.  Procedures Procedures (including critical care time)  Medications Ordered in ED Medications - No data to display   Initial Impression / Assessment and Plan / ED Course  I have reviewed the triage vital signs and the nursing notes.  Pertinent labs & imaging results that were available during my care of the patient were reviewed by me and  considered in my medical decision making (see chart for details).     26 yo F With a chief complaint of blurred vision. Patient has had benign neurologic exam otherwise. No visual field deficits. Just had her visual acuity checked yesterday in the office at urgent care. Do not suspect that the patient is having an acute stroke. Suggested that she follow-up with her eye doctor for further evaluation.  10:24 AM:  I have discussed the diagnosis/risks/treatment options with the patient and believe the pt to be eligible for discharge home to follow-up with Optho. We also discussed returning to the ED immediately if new or worsening sx occur. We discussed the sx which are most concerning (e.g., other stroke symptoms) that necessitate immediate return. Medications administered to the patient during their visit and any new prescriptions provided to the patient are listed below.  Medications given during this visit Medications - No data to display   The patient appears reasonably screen and/or stabilized for discharge and I doubt any other medical condition or other [facility] requiring further screening, evaluation, or treatment in the ED at this time prior to discharge.    Final Clinical Impressions(s) / ED Diagnoses   Final diagnoses:  Blurred vision    New Prescriptions New Prescriptions   No medications on file     Deno Etienne, DO 05/24/16 1024

## 2016-05-24 NOTE — Consult Note (Signed)
Neurology Consultation Reason for Consult: Visual changes Referring Physician: Lacinda Axon, B  CC: Visual changes  History is obtained from: Patient  HPI: Tricia Simmons is a 26 y.o. female history of end-stage renal disease due to hypertension who presents with visual changes that she first noticed on Tuesday. She states that since she first noticed it, it seems about the same, no worsening, no improvement. She denies any eye pain or pain with eye movement. She has noticed some "fullness" in her head, but not real headache. She has noticed the sensation for multiple months now, but did not worry too much about it.  Due to the visual changes, she was referred to ophthalmology where a dilated exam was performed with evidence of bilateral papilledema.  She was therefore referred back to the ER for further evaluation.  ROS: A 14 point ROS was performed and is negative except as noted in the HPI.   Past Medical History:  Diagnosis Date  . Hypertension   . Renal disorder      History reviewed. No pertinent family history.   Social History:  reports that she has never smoked. She has never used smokeless tobacco. She reports that she does not drink alcohol or use drugs.   Exam: Current vital signs: BP (!) 137/97   Pulse 70   Temp 98.4 F (36.9 C) (Oral)   Resp 16   LMP 05/18/2016   SpO2 100%  Vital signs in last 24 hours: Temp:  [98.4 F (36.9 C)-99.2 F (37.3 C)] 98.4 F (36.9 C) (04/12 1457) Pulse Rate:  [70-75] 70 (04/12 2015) Resp:  [16-17] 16 (04/12 1457) BP: (119-137)/(82-97) 137/97 (04/12 2015) SpO2:  [100 %] 100 % (04/12 2015) Weight:  [72.6 kg (160 lb)] 72.6 kg (160 lb) (04/12 0800)   Physical Exam  Constitutional: Appears well-developed and well-nourished.  Psych: Affect appropriate to situation Eyes: No scleral injection HENT: No OP obstrucion Head: Normocephalic.  Cardiovascular: Normal rate and regular rhythm.  Respiratory: Effort normal and breath sounds normal  to anterior ascultation GI: Soft.  No distension. There is no tenderness.  Skin: WDI  Neuro: Mental Status: Patient is awake, alert, oriented to person, place, month, year, and situation. Patient is able to give a clear and coherent history. No signs of aphasia or neglect Cranial Nerves: II: Visual Fields are full. Pupils are equal, round, and reactive to light.  Discs with edema III,IV, VI: EOMI without ptosis or diploplia.  V: Facial sensation is symmetric to temperature VII: Facial movement is symmetric.  VIII: hearing is intact to voice X: Uvula elevates symmetrically XI: Shoulder shrug is symmetric. XII: tongue is midline without atrophy or fasciculations.  Motor: Tone is normal. Bulk is normal. 5/5 strength was present in all four extremities.  Sensory: Sensation is symmetric to light touch and temperature in the arms and legs. Deep Tendon Reflexes: 2+ and symmetric in the biceps and patellae.  Plantars: Toes are downgoing bilaterally.  Cerebellar: FNF and HKS are intact bilaterally  I have reviewed labs in epic and the results pertinent to this consultation are: Elevated creatinine  I have reviewed the images obtained: MRI brain- no acute intracranial findings  Impression: 26 year old female with bilateral papilledema and sensation of fullness in her head for several months. I suspect that this represents intracranial hypertension and she will need lumbar puncture as well as ruling out secondary causes with an MR venogram.  Unfortunately, due to her end-stage renal disease I would be hesitant is Acetazolamide, though it may  be an option if absolutely necessary.  I would favor starting with topiramate with close follow-up of visual fields.  Recommendations: 1) lumbar puncture for opening pressure, cells, glucose, protein 2) MR venogram 3) if opening pressure is elevated, would start Topamax 50 mg twice a day for 1 week followed by 75 mg twice a day, followed by 100 mg  twice a day 4) close follow-up with ophthalmology and neurology   Roland Rack, MD [facility] [phone]  If 7pm- 7am, please page neurology on call as listed in [place].

## 2016-05-24 NOTE — ED Triage Notes (Signed)
Pt states she has had blurry vision for two days. No headache, no other symptoms.

## 2016-05-24 NOTE — ED Triage Notes (Signed)
Pt states she has had blurry vision X2 days. Seen here this morning for same. She was referred to opthamology and sent back here to have MRI.

## 2016-05-24 NOTE — Procedures (Signed)
Indication: Papilledema  Risks of the procedure were dicussed with the patient including post-LP headache, bleeding, infection, weakness/numbness of legs(radiculopathy), death.  The patient/patient's proxy agreed and written consent was obtained.   The patient was prepped and draped, and using sterile technique a 20 gauge quinke spinal needle was inserted in the L3-4 and L4-5 space. Despite attempts, bony resistance was repeatedly met and CSF was not obtained.   I still feel that she needs LP for opening pressure.   Roland Rack, MD [facility] [phone]  If 7pm- 7am, please page neurology on call as listed in [place].

## 2016-05-24 NOTE — ED Provider Notes (Signed)
[facility] DEPT Provider Note   CSN: [id number] Arrival date & time: 05/24/16  1430     History   Chief Complaint Chief Complaint  Patient presents with  . Blurred Vision    HPI Tricia Simmons is a 26 y.o. female.  The history is provided by the patient and medical records. No language interpreter was used.   Tricia Simmons is a 26 y.o. female  with a PMH of HTN, renal disorder who presents to the Emergency Department complaining of progressively worsening bilateral blurry vision for 3 days. She was seen in ER earlier this morning where she had no focal neuro deficits or visual field deficits and was referred to the ophthalmologist. Patient states she went straight to the ophthalmologist [facility]) where she had full eye examination. Patient states that they told her she had swelling in the back of her eyes and needed to come back to the emergency department for MRIs. No trauma, redness or foreign body sensation. No headaches, weakness, chest pain, shortness of breath, slurred speech. No medications or treatments prior to arrival. No alleviating or aggravating factors noted.    Past Medical History:  Diagnosis Date  . Hypertension   . Renal disorder     Patient Active Problem List   Diagnosis Date Noted  . ESRD on dialysis [facility]) 04/26/2016    Past Surgical History:  Procedure Laterality Date  . A/V SHUNTOGRAM Left 04/26/2016   Procedure: A/V Fistulagram;  Surgeon: Conrad Du Bois, MD;  Location: [facility];  Service: Cardiovascular;  Laterality: Left;  . DG AV DIALYSIS  SHUNT ACCESS EXIST*L* OR Left 11/13/2014  . PERIPHERAL VASCULAR BALLOON ANGIOPLASTY Left 04/26/2016   Procedure: Peripheral Vascular Balloon Angioplasty;  Surgeon: Conrad Pitkin, MD;  Location: [facility];  Service: Cardiovascular;  Laterality: Left;  Arm Fistula    OB History    Gravida Para Term Preterm AB Living   1         1   SAB TAB Ectopic Multiple Live Births         Home Medications    Prior to Admission medications   Medication Sig Start Date End Date Taking? Authorizing Provider  amLODipine (NORVASC) 10 MG tablet Take 10 mg by mouth at bedtime. 09/13/15  Yes Historical Provider, MD  calcium acetate (PHOSLO) 667 MG capsule Take 667-1,334 mg by mouth See admin instructions. 2 capsules before a meal and 1 capsule before a snack - up to 9 capsules daily   Yes Historical Provider, MD  labetalol (NORMODYNE) 200 MG tablet Take 400 mg by mouth 2 (two) times daily.    Yes Historical Provider, MD  lisinopril (PRINIVIL,ZESTRIL) 20 MG tablet Take 20 mg by mouth at bedtime. 10/20/15  Yes Historical Provider, MD  metroNIDAZOLE (FLAGYL) 500 MG tablet Take 1 tablet (500 mg total) by mouth 2 (two) times daily. 05/23/16  Yes Barnet Glasgow, NP  SENSIPAR 60 MG tablet Take 60 mg by mouth daily after supper. 10/21/15  Yes Historical Provider, MD  valACYclovir (VALTREX) 1000 MG tablet Take 1,000 mg by mouth daily. 10/20/15  Yes Historical Provider, MD  topiramate (TOPAMAX) 25 MG tablet Take 1 tablet (25 mg total) by mouth 2 (two) times daily. Take 2 tablets (50mg  total) by mouth twice daily for one week. Then increase dose to 3 tablets (75mg  total) twice daily for one week. Then increase dose to 4 tablets (100mg  total). 05/25/16   [place], PA-C    Family History History  reviewed. No pertinent family history.  Social History Social History  Substance Use Topics  . Smoking status: Never Smoker  . Smokeless tobacco: Never Used  . Alcohol use No     Allergies   Patient has no known allergies.   Review of Systems Review of Systems  Eyes: Positive for visual disturbance (Blurred vision bilaterally).  All other systems reviewed and are negative.    Physical Exam Updated Vital Signs BP 136/89 (BP Location: Right Arm)   Pulse 85   Temp 98.5 F (36.9 C) (Oral)   Resp 16   LMP 05/18/2016   SpO2 100%   Physical Exam  Constitutional: She is oriented  to person, place, and time. She appears well-developed and well-nourished. No distress.  HENT:  Head: Normocephalic and atraumatic.  Eyes: EOM are normal.  PERRL, size 7 bilaterally: did just have dilated eye exam. Peripheral visual fields grossly normal.  Cardiovascular: Normal rate, regular rhythm and normal heart sounds.   No murmur heard. Pulmonary/Chest: Effort normal and breath sounds normal. No respiratory distress.  Abdominal: Soft. She exhibits no distension. There is no tenderness.  Musculoskeletal: She exhibits no edema.  Neurological: She is alert and oriented to person, place, and time.  Skin: Skin is warm and dry.  Nursing note and vitals reviewed.    ED Treatments / Results  Labs (all labs ordered are listed, but only abnormal results are displayed) Labs Reviewed  CBC WITH DIFFERENTIAL/PLATELET - Abnormal; Notable for the following:       Result Value   RBC 3.33 (*)    Hemoglobin 10.5 (*)    HCT 33.2 (*)    RDW 17.6 (*)    All other components within normal limits  BASIC METABOLIC PANEL - Abnormal; Notable for the following:    Sodium 134 (*)    Chloride 95 (*)    Creatinine, Ser 8.79 (*)    GFR calc non Af Amer 6 (*)    GFR calc Af Amer 7 (*)    All other components within normal limits  PROTEIN, CSF - Abnormal; Notable for the following:    Total  Protein, CSF 12 (*)    All other components within normal limits  CSF CELL COUNT WITH DIFFERENTIAL - Abnormal; Notable for the following:    RBC Count, CSF 6 (*)    All other components within normal limits  CSF CULTURE  GLUCOSE, CSF    EKG  EKG Interpretation None       Radiology Mr Brain Wo Contrast  Result Date: 05/24/2016 CLINICAL DATA:  Blurry vision for 4 days. History of hypertension, end-stage renal disease on dialysis. EXAM: MRI HEAD AND ORBITS WITHOUT CONTRAST TECHNIQUE: Multiplanar, multiecho pulse sequences of the brain and surrounding structures were obtained without intravenous contrast.  Multiplanar, multiecho pulse sequences of the orbits and surrounding structures were obtained including fat saturation techniques, without intravenous contrast administration. COMPARISON:  None. FINDINGS: MRI HEAD FINDINGS BRAIN: No reduced diffusion to suggest acute ischemia or hyperacute demyelination. No susceptibility artifact to suggest hemorrhage. The ventricles and sulci are normal for patient's age. No suspicious parenchymal signal, masses or mass effect. No abnormal extra-axial fluid collections. VASCULAR: Normal major intracranial vascular flow voids present at skull base. SKULL AND UPPER CERVICAL SPINE: No abnormal sellar expansion. No suspicious calvarial bone marrow signal. Craniocervical junction maintained. OTHER: None. MRI ORBITS FINDINGS ORBITS: Ocular globes are intact with normal signal. Lenses are located. Preservation of the orbital fat. Slightly patulous optic nerves please distally with  slight kinking. Normal symmetric appearance of the extraocular muscles. No intra-ocular mass, signal abnormality. Superior ophthalmic veins are not enlarged. VISUALIZED SINUSES: Well-aerated. SOFT TISSUES: Normal. IMPRESSION: Normal noncontrast MRI head. Slightly patulous optic nerve sheath and kinked optic nerves can be normal but also associated with intracranial hypertension without corroborative findings. Electronically Signed   By: Elon Alas M.D.   On: 05/24/2016 18:05   Mr Venogram Head  Result Date: 05/24/2016 CLINICAL DATA:  Visual changes, head fullness. Diagnosed with papilledema. History of hypertension and end-stage renal disease on dialysis. EXAM: MR VENOGRAM the HEAD WITHOUT CONTRAST TECHNIQUE: Angiographic images of the intracranial venous structures were obtained using MRV technique without intravenous contrast. COMPARISON:  MRI of the head May 24, 2016 FINDINGS: Normal flow related enhancement within the superior sagittal sinus, torcula of the Herophili. The RIGHT transverse sinus  is dominant. Thready flow related enhancement through the LEFT transverse sinus and sigmoid sinus, with corresponding normal signal on today's MRI through the segments nose is most compatible with developmental variant. Normal flow related enhancement of the internal cerebral veins. IMPRESSION: No dural venous sinus thrombosis by noncontrast MRV. Electronically Signed   By: Elon Alas M.D.   On: 05/24/2016 22:33   Mr Farley Ly Contrast  Result Date: 05/24/2016 CLINICAL DATA:  Blurry vision for 4 days. History of hypertension, end-stage renal disease on dialysis. EXAM: MRI HEAD AND ORBITS WITHOUT CONTRAST TECHNIQUE: Multiplanar, multiecho pulse sequences of the brain and surrounding structures were obtained without intravenous contrast. Multiplanar, multiecho pulse sequences of the orbits and surrounding structures were obtained including fat saturation techniques, without intravenous contrast administration. COMPARISON:  None. FINDINGS: MRI HEAD FINDINGS BRAIN: No reduced diffusion to suggest acute ischemia or hyperacute demyelination. No susceptibility artifact to suggest hemorrhage. The ventricles and sulci are normal for patient's age. No suspicious parenchymal signal, masses or mass effect. No abnormal extra-axial fluid collections. VASCULAR: Normal major intracranial vascular flow voids present at skull base. SKULL AND UPPER CERVICAL SPINE: No abnormal sellar expansion. No suspicious calvarial bone marrow signal. Craniocervical junction maintained. OTHER: None. MRI ORBITS FINDINGS ORBITS: Ocular globes are intact with normal signal. Lenses are located. Preservation of the orbital fat. Slightly patulous optic nerves please distally with slight kinking. Normal symmetric appearance of the extraocular muscles. No intra-ocular mass, signal abnormality. Superior ophthalmic veins are not enlarged. VISUALIZED SINUSES: Well-aerated. SOFT TISSUES: Normal. IMPRESSION: Normal noncontrast MRI head. Slightly  patulous optic nerve sheath and kinked optic nerves can be normal but also associated with intracranial hypertension without corroborative findings. Electronically Signed   By: Elon Alas M.D.   On: 05/24/2016 18:05   Dg Lumbar Puncture Fluoro Guide  Result Date: 05/24/2016 CLINICAL DATA:  Acute onset of blurred vision. Assess for increased opening pressure. Initial encounter. EXAM: DIAGNOSTIC LUMBAR PUNCTURE UNDER FLUOROSCOPIC GUIDANCE FLUOROSCOPY TIME:  Fluoroscopy Time:  1 minute 24 seconds Number of Acquired Spot Images: 1 PROCEDURE: Informed consent was obtained from the patient prior to the procedure, including potential complications of headache, allergy, and pain. With the patient prone, the lower back was prepped with Betadine. 1% Lidocaine was used for local anesthesia. Lumbar puncture was performed at the L4-L5 level using a 22 gauge needle with return of clear CSF. The opening pressure was approximately 23 cm water, including the length of the needle, and measured prone, though at a 11 degree table tilt. CSF was only produced after tilting the table and with the needle inserted to its hub, due to the patient's habitus and positioning. 8  ml of CSF were obtained for laboratory studies. The patient tolerated the procedure well and there were no apparent complications. IMPRESSION: Opening pressure of approximately 23 cm water noted; given the mild tilt of the table, actual opening pressure was likely slightly lower. This is in the borderline normal range. Electronically Signed   By: Garald Balding M.D.   On: 05/24/2016 23:33    Procedures Procedures (including critical care time)  Medications Ordered in ED Medications  lidocaine (XYLOCAINE) 1 % (with pres) injection (not administered)  lidocaine (PF) (XYLOCAINE) 1 % injection 30 mL (30 mLs Other Given 05/24/16 2054)  lidocaine (PF) (XYLOCAINE) 1 % injection (20 mLs Intraspinal Given 05/24/16 2301)     Initial Impression / Assessment  and Plan / ED Course  I have reviewed the triage vital signs and the nursing notes.  Pertinent labs & imaging results that were available during my care of the patient were reviewed by me and considered in my medical decision making (see chart for details).    Sylvi Rybolt is a 26 y.o. female who presents to ED for blurred vision x 3 days. Seen by ophthalmology and referred to ER for MR.   Neurology, Dr. Paschal Dopp, consulted. Per Dr. Paschal Dopp, obtain MR brain and orbits. Hold on MRV. Admit to medicine and neurology will evaluate.   MRI results reviewed:   IMPRESSION: Normal noncontrast MRI head.  Slightly patulous optic nerve sheath and kinked optic nerves can be normal but also associated with intracranial hypertension without corroborative findings.  7:15 PM - Neurology re-paged regarding MRI results. Dr. Leonel Ramsay at bedside.   8:26 PM - Dr. Leonel Ramsay at bedside performing LP.   9:14 PM - LP unsuccessful, discussed case with radiology to order fluoro guided LP done; MRV pending  MRV: No dural venous sinus thrombosis LP with opening pressure of approx. 23-35. Glucose and wbc wdl. Protein 12.  Discussed results with neurology once again - appreciate assistance. Will start on topamax and have patient follow up with neurology as outpatient. rx given. Also will need to keep appointment with eye doctor - already scheduled for next week with Dr. Valetta Close. Patient understands return precautions and follow up plan. All questions answered.   Final Clinical Impressions(s) / ED Diagnoses   Final diagnoses:  Blurred vision    New Prescriptions Discharge Medication List as of 05/25/2016 12:47 AM    START taking these medications   Details  topiramate (TOPAMAX) 25 MG tablet Take 1 tablet (25 mg total) by mouth 2 (two) times daily. Take 2 tablets (50mg  total) by mouth twice daily for one week. Then increase dose to 3 tablets (75mg  total) twice daily for one week. Then increase dose to 4  tablets (100mg  total)., Starting Fri 4 /13/2018, Print         [employer] [place], PA-C 05/25/16 0104    Nat Christen, MD 05/28/16 [place], MD 05/28/16 1345

## 2016-05-24 NOTE — ED Notes (Signed)
Pts opthalmology office called to state they were sending patient to ER due to bilateral optic nerve swelling, sent here to have MRI of brain and orbits. Pt to arrive POV.

## 2016-05-24 NOTE — ED Notes (Signed)
Dr. Leonel Ramsay attempted LP at bedside but was unsuccessful.

## 2016-05-24 NOTE — ED Notes (Signed)
Pt returned from radiology.

## 2016-05-25 LAB — PROTEIN, CSF: Total  Protein, CSF: 12 mg/dL — ABNORMAL LOW (ref 15–45)

## 2016-05-25 LAB — CERVICOVAGINAL ANCILLARY ONLY
Bacterial vaginitis: POSITIVE — AB
Candida vaginitis: NEGATIVE
Chlamydia: NEGATIVE
NEISSERIA GONORRHEA: NEGATIVE
TRICH (WINDOWPATH): NEGATIVE

## 2016-05-25 LAB — GLUCOSE, CSF: GLUCOSE CSF: 50 mg/dL (ref 40–70)

## 2016-05-25 MED ORDER — TOPIRAMATE 25 MG PO TABS: 25.0000 mg | ORAL_TABLET | Freq: Two times a day (BID) | ORAL | 0 refills | Status: DC

## 2016-05-25 NOTE — Discharge Instructions (Signed)
Keep your scheduled appointment with Dr. Valetta Close (ophthalmology) on 4/19.  Follow up with neurology in the next week or two.  Return to ER for new or worsening symptoms, any additional concerns.

## 2016-05-28 LAB — CSF CULTURE W GRAM STAIN: Culture: NO GROWTH

## 2016-05-28 LAB — CSF CULTURE: SPECIAL REQUESTS: NORMAL

## 2016-06-28 ENCOUNTER — Ambulatory Visit: Payer: Medicare Other | Admitting: Neurology

## 2016-06-28 ENCOUNTER — Telehealth: Payer: Self-pay

## 2016-06-28 NOTE — Telephone Encounter (Signed)
Pt no-showed her new pt appt this afternoon.

## 2016-07-02 ENCOUNTER — Encounter: Payer: Self-pay | Admitting: Neurology

## 2016-07-27 ENCOUNTER — Encounter: Payer: Self-pay | Admitting: Vascular Surgery

## 2016-08-08 NOTE — Progress Notes (Deleted)
Established Dialysis Access   History of Present Illness   Tricia Simmons is a 26 y.o. (12/08/1990) female who presents for re-evaluation for permanent access.  The patient is *** hand dominant.  Previous access procedures have been completed in the left arm.  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  On her prior venoplasty on 04/26/16, I recommended: surgical revision of the fistula with transposition of the proximal radiocephalic arteriovenous fistula   to cubital vein rather reattemptng venoplasty.  The patient has *** had a previous PPM placed.  The patient's PMH, PSH, SH, and FamHx are unchanged from 04/26/16.  Current Outpatient Prescriptions  Medication Sig Dispense Refill  . amLODipine (NORVASC) 10 MG tablet Take 10 mg by mouth at bedtime.  4  . calcium acetate (PHOSLO) 667 MG capsule Take 667-1,334 mg by mouth See admin instructions. 2 capsules before a meal and 1 capsule before a snack - up to 9 capsules daily    . labetalol (NORMODYNE) 200 MG tablet Take 400 mg by mouth 2 (two) times daily.     Marland Kitchen lisinopril (PRINIVIL,ZESTRIL) 20 MG tablet Take 20 mg by mouth at bedtime.  3  . metroNIDAZOLE (FLAGYL) 500 MG tablet Take 1 tablet (500 mg total) by mouth 2 (two) times daily. 14 tablet 0  . SENSIPAR 60 MG tablet Take 60 mg by mouth daily after supper.  5  . topiramate (TOPAMAX) 25 MG tablet Take 1 tablet (25 mg total) by mouth 2 (two) times daily. Take 2 tablets (50mg  total) by mouth twice daily for one week. Then increase dose to 3 tablets (75mg  total) twice daily for one week. Then increase dose to 4 tablets (100mg  total). 130 tablet 0  . valACYclovir (VALTREX) 1000 MG tablet Take 1,000 mg by mouth daily.  0   No current facility-administered medications for this visit.     On ROS today: ***, ***   Physical Examination  ***There were no vitals filed for this visit. ***There is no height or weight on file to calculate  BMI.  General {LOC:19197::"Somulent","Alert"}, {Orientation:19197::"Confused","O x 3"}, {Weight:19197::"Obese","Cachectic","WD"}, {General state of health:19197::"Ill appearing","Elderly","NAD"}  Pulmonary {Chest wall:19197::"Asx chest movement","Sym exp"}, {Air movt:19197::"Decreased *** air movt","good B air movt"}, {BS:19197::"rales on ***","rhonchi on ***","wheezing on ***","CTA B"}  Cardiac {Rhythm:19197::"Irregularly, irregular rate and rhythm","RRR, Nl S1, S2"}, {Murmur:19197::"Murmur present: ***","no Murmurs"}, {Rubs:19197::"Rub present: ***","No rubs"}, {Gallop:19197::"Gallop present: ***","No S3,S4"}  Vascular Vessel Right Left  Radial {Palpable:19197::"Not palpable","Faintly palpable","Palpable"} {Palpable:19197::"Not palpable","Faintly palpable","Palpable"}  Brachial {Palpable:19197::"Not palpable","Faintly palpable","Palpable"} {Palpable:19197::"Not palpable","Faintly palpable","Palpable"}  Ulnar {Palpable:19197::"Not palpable","Faintly palpable","Palpable"} {Palpable:19197::"Not palpable","Faintly palpable","Palpable"}    Musculo- skeletal M/S 5/5 throughout {MS:19197::"except ***"," "}, Extremities without ischemic changes {MS:19197::"except ***"," "}  Neurologic Pain and light touch intact in extremities{CN:19197::" except for decreased sensation in ***"," "}, Motor exam as listed above    Medical Decision Making   Tricia Simmons is a 26 y.o. female who presents with ESRD requiring hemodialysis.    ***  I had an extensive discussion with this patient in regards to the nature of access surgery, including risk, benefits, and alternatives.    The patient is aware that the risks of access surgery include but are not limited to: bleeding, infection, steal syndrome, nerve damage, ischemic monomelic neuropathy, failure of access to mature, and possible need for additional access procedures in the future.  ***The patient is aware that I construct transposition procedures in two  stages, requiring a separate operation to complete the procedure.  The patient has *** agreed to  proceed with the above procedure which will be scheduled ***.   Adele Barthel, MD, FACS [facility] Office: [phone] Pager: [phone]

## 2016-08-10 ENCOUNTER — Encounter (HOSPITAL_COMMUNITY): Payer: Medicare Other

## 2016-08-10 ENCOUNTER — Ambulatory Visit: Payer: Medicare Other | Admitting: Vascular Surgery

## 2016-11-18 ENCOUNTER — Ambulatory Visit (HOSPITAL_COMMUNITY)
Admission: EM | Admit: 2016-11-18 | Discharge: 2016-11-18 | Disposition: A | Discharge status: Home / Self Care | Payer: Medicare Other | Attending: Urgent Care | Admitting: Urgent Care

## 2016-11-18 ENCOUNTER — Encounter (HOSPITAL_COMMUNITY): Payer: Self-pay | Admitting: Family Medicine

## 2016-11-18 DIAGNOSIS — Z992 Dependence on renal dialysis: Secondary | ICD-10-CM

## 2016-11-18 DIAGNOSIS — B373 Candidiasis of vulva and vagina: Secondary | ICD-10-CM | POA: Diagnosis not present

## 2016-11-18 DIAGNOSIS — B3731 Acute candidiasis of vulva and vagina: Secondary | ICD-10-CM

## 2016-11-18 DIAGNOSIS — I10 Essential (primary) hypertension: Secondary | ICD-10-CM

## 2016-11-18 DIAGNOSIS — N186 End stage renal disease: Secondary | ICD-10-CM

## 2016-11-18 MED ORDER — FLUCONAZOLE 150 MG PO TABS: 150.0000 mg | ORAL_TABLET | Freq: Once | ORAL | 0 refills | Status: AC

## 2016-11-18 NOTE — ED Provider Notes (Signed)
  MRN: [id number] DOB: 28-Apr-1990  Subjective:   Tricia Simmons is a 26 y.o. female presenting for chief complaint of Vaginitis  Reports 6 day history of vaginal itching, irritation. Has a history of yeast infections. She is on dialysis. Denies genital rash, dysuria, urinary frequency, hematuria, fever, n/v, abdominal pain, pelvic pain. Has a history of genital herpes but denies an outbreak.  Tricia Simmons has No Known Allergies.  Tricia Simmons  has a past medical history of Hypertension and Renal disorder. Also  has a past surgical history that includes DG AV DIALYSIS  SHUNT ACCESS EXIST*L* OR (Left, 11/13/2014); A/V Fistulagram (Left, 04/26/2016); and PERIPHERAL VASCULAR BALLOON ANGIOPLASTY (Left, 04/26/2016).  Objective:   Vitals: BP (!) 149/93   Pulse 73   Temp 98.5 F (36.9 C)   Resp 18   SpO2 100%   Physical Exam  Constitutional: She is oriented to person, place, and time. She appears well-developed and well-nourished.  Cardiovascular: Normal rate.   Pulmonary/Chest: Effort normal.  Neurological: She is alert and oriented to person, place, and time.  Psychiatric: She has a normal mood and affect.    Assessment and Plan :   Yeast vaginitis  CKD (chronic kidney disease) requiring chronic dialysis [facility])  Essential hypertension   Per UpToDate, no dosage adjustment is necessary for single dose therapy of vaginal candidiasis. Patient will use 150mg  diflucan today and follow up with nephrologist for multiple dosing of diflucan for yeast infections in the future. Patient verbalized understanding.  Jaynee Eagles, PA-C [facility]  11/18/2016  5:02 PM    Jaynee Eagles, PA-C 11/18/16 1728

## 2016-11-18 NOTE — ED Triage Notes (Signed)
Pt here for vaginal itching and irritation.

## 2016-11-18 NOTE — Discharge Instructions (Signed)
Please ask your kidney doctor that helps you with your dialysis to prescribe you diflucan after you have your dialysis.

## 2017-03-07 ENCOUNTER — Other Ambulatory Visit: Payer: Self-pay

## 2017-03-07 ENCOUNTER — Encounter (HOSPITAL_COMMUNITY): Payer: Self-pay | Admitting: *Deleted

## 2017-03-07 DIAGNOSIS — N186 End stage renal disease: Secondary | ICD-10-CM | POA: Diagnosis not present

## 2017-03-07 DIAGNOSIS — T82868A Thrombosis of vascular prosthetic devices, implants and grafts, initial encounter: Secondary | ICD-10-CM | POA: Diagnosis not present

## 2017-03-07 DIAGNOSIS — Z992 Dependence on renal dialysis: Secondary | ICD-10-CM

## 2017-03-07 DIAGNOSIS — I120 Hypertensive chronic kidney disease with stage 5 chronic kidney disease or end stage renal disease: Secondary | ICD-10-CM | POA: Insufficient documentation

## 2017-03-07 DIAGNOSIS — Z79899 Other long term (current) drug therapy: Secondary | ICD-10-CM

## 2017-03-07 DIAGNOSIS — Y832 Surgical operation with anastomosis, bypass or graft as the cause of abnormal reaction of the patient, or of later complication, without mention of misadventure at the time of the procedure: Secondary | ICD-10-CM | POA: Diagnosis not present

## 2017-03-07 NOTE — ED Notes (Signed)
Lab work, radiology results and vital signs reviewed, no critical results at this time, no change in acuity indicated.  

## 2017-03-07 NOTE — ED Triage Notes (Signed)
Pt reports pain to her fistula in left arm, pain started yesterday following her treatment. No swelling noted.

## 2017-03-08 ENCOUNTER — Encounter (HOSPITAL_COMMUNITY): Payer: Self-pay | Admitting: Emergency Medicine

## 2017-03-08 ENCOUNTER — Ambulatory Visit (HOSPITAL_COMMUNITY): Payer: Managed Care, Other (non HMO) | Admitting: Anesthesiology

## 2017-03-08 ENCOUNTER — Encounter (HOSPITAL_COMMUNITY)
Admission: AD | Disposition: A | Discharge status: Home / Self Care | Payer: Self-pay | Source: Ambulatory Visit | Attending: Vascular Surgery

## 2017-03-08 ENCOUNTER — Encounter (HOSPITAL_COMMUNITY): Payer: Self-pay

## 2017-03-08 ENCOUNTER — Ambulatory Visit (HOSPITAL_COMMUNITY)
Admission: AD | Admit: 2017-03-08 | Discharge: 2017-03-08 | Disposition: A | Discharge status: Home / Self Care | Payer: Managed Care, Other (non HMO) | Source: Ambulatory Visit | Attending: Vascular Surgery | Admitting: Vascular Surgery

## 2017-03-08 ENCOUNTER — Ambulatory Visit (HOSPITAL_COMMUNITY): Payer: Managed Care, Other (non HMO)

## 2017-03-08 ENCOUNTER — Emergency Department (HOSPITAL_COMMUNITY)
Admission: EM | Admit: 2017-03-08 | Discharge: 2017-03-08 | Disposition: A | Discharge status: Home / Self Care | Payer: Managed Care, Other (non HMO) | Source: Home / Self Care | Attending: Emergency Medicine | Admitting: Emergency Medicine

## 2017-03-08 DIAGNOSIS — T82868A Thrombosis of vascular prosthetic devices, implants and grafts, initial encounter: Secondary | ICD-10-CM | POA: Insufficient documentation

## 2017-03-08 DIAGNOSIS — Z992 Dependence on renal dialysis: Secondary | ICD-10-CM | POA: Insufficient documentation

## 2017-03-08 DIAGNOSIS — N186 End stage renal disease: Secondary | ICD-10-CM

## 2017-03-08 DIAGNOSIS — Z419 Encounter for procedure for purposes other than remedying health state, unspecified: Secondary | ICD-10-CM

## 2017-03-08 DIAGNOSIS — Y832 Surgical operation with anastomosis, bypass or graft as the cause of abnormal reaction of the patient, or of later complication, without mention of misadventure at the time of the procedure: Secondary | ICD-10-CM | POA: Insufficient documentation

## 2017-03-08 DIAGNOSIS — I120 Hypertensive chronic kidney disease with stage 5 chronic kidney disease or end stage renal disease: Secondary | ICD-10-CM | POA: Insufficient documentation

## 2017-03-08 HISTORY — PX: PATCH ANGIOPLASTY: SHX6230

## 2017-03-08 HISTORY — PX: THROMBECTOMY W/ EMBOLECTOMY: SHX2507

## 2017-03-08 HISTORY — PX: INTRAOPERATIVE ARTERIOGRAM: SHX5157

## 2017-03-08 HISTORY — DX: End stage renal disease: N18.6

## 2017-03-08 LAB — CBC WITH DIFFERENTIAL/PLATELET
Basophils Absolute: 0 10*3/uL (ref 0.0–0.1)
Basophils Relative: 0 %
EOS ABS: 0.4 10*3/uL (ref 0.0–0.7)
Eosinophils Relative: 5 %
HEMATOCRIT: 39.1 % (ref 36.0–46.0)
HEMOGLOBIN: 12.2 g/dL (ref 12.0–15.0)
LYMPHS ABS: 2 10*3/uL (ref 0.7–4.0)
Lymphocytes Relative: 24 %
MCH: 30.7 pg (ref 26.0–34.0)
MCHC: 31.2 g/dL (ref 30.0–36.0)
MCV: 98.2 fL (ref 78.0–100.0)
MONOS PCT: 8 %
Monocytes Absolute: 0.7 10*3/uL (ref 0.1–1.0)
Neutro Abs: 5 10*3/uL (ref 1.7–7.7)
Neutrophils Relative %: 63 %
Platelets: 193 10*3/uL (ref 150–400)
RBC: 3.98 MIL/uL (ref 3.87–5.11)
RDW: 19.1 % — ABNORMAL HIGH (ref 11.5–15.5)
WBC: 8.1 10*3/uL (ref 4.0–10.5)

## 2017-03-08 LAB — I-STAT CHEM 8, ED
BUN: 42 mg/dL — AB (ref 6–20)
CALCIUM ION: 1.16 mmol/L (ref 1.15–1.40)
CREATININE: 12.3 mg/dL — AB (ref 0.44–1.00)
Chloride: 101 mmol/L (ref 101–111)
GLUCOSE: 85 mg/dL (ref 65–99)
HCT: 40 % (ref 36.0–46.0)
Hemoglobin: 13.6 g/dL (ref 12.0–15.0)
Potassium: 5.3 mmol/L — ABNORMAL HIGH (ref 3.5–5.1)
SODIUM: 137 mmol/L (ref 135–145)
TCO2: 26 mmol/L (ref 22–32)

## 2017-03-08 LAB — POCT PREGNANCY, URINE: Preg Test, Ur: NEGATIVE

## 2017-03-08 SURGERY — THROMBECTOMY ARTERIOVENOUS FISTULA
Anesthesia: Monitor Anesthesia Care | Site: Arm Upper

## 2017-03-08 MED ORDER — HEPARIN SODIUM (PORCINE) 1000 UNIT/ML IJ SOLN
INTRAMUSCULAR | Status: AC
  Filled 2017-03-08: qty 1

## 2017-03-08 MED ORDER — MIDAZOLAM HCL 2 MG/2ML IJ SOLN
INTRAMUSCULAR | Status: AC
  Filled 2017-03-08: qty 2

## 2017-03-08 MED ORDER — PROPOFOL 10 MG/ML IV BOLUS
INTRAVENOUS | Status: AC
  Filled 2017-03-08: qty 40

## 2017-03-08 MED ORDER — DOXYCYCLINE HYCLATE 100 MG PO TABS
100.0000 mg | ORAL_TABLET | Freq: Once | ORAL | Status: AC
  Administered 2017-03-08: 100 mg via ORAL
  Filled 2017-03-08: qty 1

## 2017-03-08 MED ORDER — KETOROLAC TROMETHAMINE 30 MG/ML IJ SOLN
15.0000 mg | Freq: Once | INTRAMUSCULAR | Status: AC
  Administered 2017-03-08: 15 mg via INTRAVENOUS
  Filled 2017-03-08: qty 1

## 2017-03-08 MED ORDER — CEFAZOLIN SODIUM-DEXTROSE 1-4 GM/50ML-% IV SOLN
INTRAVENOUS | Status: DC | PRN
  Administered 2017-03-08: 2 g via INTRAVENOUS

## 2017-03-08 MED ORDER — ONDANSETRON HCL 4 MG/2ML IJ SOLN
INTRAMUSCULAR | Status: DC | PRN
  Administered 2017-03-08: 4 mg via INTRAVENOUS

## 2017-03-08 MED ORDER — LIDOCAINE-EPINEPHRINE (PF) 1 %-1:200000 IJ SOLN
INTRAMUSCULAR | Status: DC | PRN
  Administered 2017-03-08: 30 mL

## 2017-03-08 MED ORDER — MEPERIDINE HCL 25 MG/ML IJ SOLN: 6.2500 mg | INTRAMUSCULAR | Status: DC | PRN

## 2017-03-08 MED ORDER — CEPHALEXIN 250 MG PO CAPS
500.0000 mg | ORAL_CAPSULE | Freq: Once | ORAL | Status: AC
  Administered 2017-03-08: 500 mg via ORAL
  Filled 2017-03-08: qty 2

## 2017-03-08 MED ORDER — FENTANYL CITRATE (PF) 250 MCG/5ML IJ SOLN
INTRAMUSCULAR | Status: AC
  Filled 2017-03-08: qty 5

## 2017-03-08 MED ORDER — 0.9 % SODIUM CHLORIDE (POUR BTL) OPTIME
TOPICAL | Status: DC | PRN
  Administered 2017-03-08: 1000 mL

## 2017-03-08 MED ORDER — CEPHALEXIN 500 MG PO CAPS: 500.0000 mg | ORAL_CAPSULE | Freq: Four times a day (QID) | ORAL | 0 refills | Status: DC

## 2017-03-08 MED ORDER — MIDAZOLAM HCL 5 MG/5ML IJ SOLN
INTRAMUSCULAR | Status: DC | PRN
  Administered 2017-03-08 (×4): 1 mg via INTRAVENOUS

## 2017-03-08 MED ORDER — LIDOCAINE HCL 1 % IJ SOLN
INTRAMUSCULAR | Status: DC | PRN
  Administered 2017-03-08: 3 mL

## 2017-03-08 MED ORDER — LIDOCAINE-EPINEPHRINE (PF) 1 %-1:200000 IJ SOLN
INTRAMUSCULAR | Status: AC
  Filled 2017-03-08: qty 30

## 2017-03-08 MED ORDER — ONDANSETRON HCL 4 MG/2ML IJ SOLN
INTRAMUSCULAR | Status: AC
  Filled 2017-03-08: qty 2

## 2017-03-08 MED ORDER — LIDOCAINE 2% (20 MG/ML) 5 ML SYRINGE
INTRAMUSCULAR | Status: AC
  Filled 2017-03-08: qty 5

## 2017-03-08 MED ORDER — CEFAZOLIN SODIUM 1 G IJ SOLR
INTRAMUSCULAR | Status: AC
  Filled 2017-03-08: qty 20

## 2017-03-08 MED ORDER — PROPOFOL 10 MG/ML IV BOLUS
INTRAVENOUS | Status: DC | PRN
  Administered 2017-03-08: 130 mg via INTRAVENOUS

## 2017-03-08 MED ORDER — PROMETHAZINE HCL 25 MG/ML IJ SOLN: 6.2500 mg | INTRAMUSCULAR | Status: DC | PRN

## 2017-03-08 MED ORDER — PROTAMINE SULFATE 10 MG/ML IV SOLN
INTRAVENOUS | Status: DC | PRN
  Administered 2017-03-08: 40 mg via INTRAVENOUS

## 2017-03-08 MED ORDER — FENTANYL CITRATE (PF) 100 MCG/2ML IJ SOLN: 25.0000 ug | INTRAMUSCULAR | Status: DC | PRN

## 2017-03-08 MED ORDER — SODIUM CHLORIDE 0.9 % IV SOLN
INTRAVENOUS | Status: DC | PRN
  Administered 2017-03-08: 12:00:00 via INTRAVENOUS

## 2017-03-08 MED ORDER — PROPOFOL 500 MG/50ML IV EMUL
INTRAVENOUS | Status: DC | PRN
  Administered 2017-03-08: 25 ug/kg/min via INTRAVENOUS

## 2017-03-08 MED ORDER — HEPARIN SODIUM (PORCINE) 5000 UNIT/ML IJ SOLN
INTRAMUSCULAR | Status: DC | PRN
  Administered 2017-03-08: 500 mL

## 2017-03-08 MED ORDER — PROTAMINE SULFATE 10 MG/ML IV SOLN
INTRAVENOUS | Status: AC
  Filled 2017-03-08: qty 5

## 2017-03-08 MED ORDER — LACTATED RINGERS IV SOLN: INTRAVENOUS | Status: DC

## 2017-03-08 MED ORDER — DOXYCYCLINE HYCLATE 100 MG PO CAPS: 100.0000 mg | ORAL_CAPSULE | Freq: Two times a day (BID) | ORAL | 0 refills | Status: DC

## 2017-03-08 MED ORDER — OXYCODONE-ACETAMINOPHEN 5-325 MG PO TABS: 1.0000 | ORAL_TABLET | Freq: Four times a day (QID) | ORAL | 0 refills | Status: DC | PRN

## 2017-03-08 MED ORDER — HEPARIN SODIUM (PORCINE) 1000 UNIT/ML IJ SOLN
INTRAMUSCULAR | Status: DC | PRN
  Administered 2017-03-08: 6000 [IU] via INTRAVENOUS
  Administered 2017-03-08: 2000 [IU] via INTRAVENOUS

## 2017-03-08 MED ORDER — FENTANYL CITRATE (PF) 100 MCG/2ML IJ SOLN
INTRAMUSCULAR | Status: DC | PRN
  Administered 2017-03-08 (×4): 50 ug via INTRAVENOUS

## 2017-03-08 MED ORDER — IODIXANOL 320 MG/ML IV SOLN
INTRAVENOUS | Status: DC | PRN
  Administered 2017-03-08: 16 mL via INTRAVENOUS

## 2017-03-08 MED ORDER — LIDOCAINE HCL (PF) 1 % IJ SOLN
INTRAMUSCULAR | Status: AC
Start: 2017-03-08 — End: 2017-03-08
  Filled 2017-03-08: qty 30

## 2017-03-08 SURGICAL SUPPLY — 46 items
BAG DECANTER FOR FLEXI CONT (MISCELLANEOUS) ×5 IMPLANT
BIOPATCH RED 1 DISK 7.0 (GAUZE/BANDAGES/DRESSINGS) ×4 IMPLANT
BIOPATCH RED 1IN DISK 7.0MM (GAUZE/BANDAGES/DRESSINGS) ×1
CATH EMB 4FR 80CM (CATHETERS) ×5 IMPLANT
CATH PALINDROME RT-P 15FX19CM (CATHETERS) IMPLANT
CATH PALINDROME RT-P 15FX23CM (CATHETERS) IMPLANT
CATH PALINDROME RT-P 15FX28CM (CATHETERS) IMPLANT
CATH PALINDROME RT-P 15FX55CM (CATHETERS) IMPLANT
CHLORAPREP W/TINT 26ML (MISCELLANEOUS) ×5 IMPLANT
COVER PROBE W GEL 5X96 (DRAPES) IMPLANT
COVER SURGICAL LIGHT HANDLE (MISCELLANEOUS) ×5 IMPLANT
DERMABOND ADVANCED (GAUZE/BANDAGES/DRESSINGS) ×2
DERMABOND ADVANCED .7 DNX12 (GAUZE/BANDAGES/DRESSINGS) ×3 IMPLANT
DRAPE C-ARM 42X72 X-RAY (DRAPES) ×5 IMPLANT
DRAPE CHEST BREAST 15X10 FENES (DRAPES) ×5 IMPLANT
DRAPE X-RAY CASS 24X20 (DRAPES) ×5 IMPLANT
GAUZE SPONGE 4X4 16PLY XRAY LF (GAUZE/BANDAGES/DRESSINGS) ×10 IMPLANT
GLOVE BIO SURGEON STRL SZ7.5 (GLOVE) ×5 IMPLANT
GLOVE BIOGEL PI IND STRL 8 (GLOVE) ×3 IMPLANT
GLOVE BIOGEL PI INDICATOR 8 (GLOVE) ×2
GOWN STRL REUS W/ TWL LRG LVL3 (GOWN DISPOSABLE) ×6 IMPLANT
GOWN STRL REUS W/TWL LRG LVL3 (GOWN DISPOSABLE) ×4
KIT BASIN OR (CUSTOM PROCEDURE TRAY) ×5 IMPLANT
KIT ROOM TURNOVER OR (KITS) ×5 IMPLANT
NEEDLE 18GX1X1/2 (RX/OR ONLY) (NEEDLE) ×5 IMPLANT
NEEDLE HYPO 25GX1X1/2 BEV (NEEDLE) ×5 IMPLANT
NS IRRIG 1000ML POUR BTL (IV SOLUTION) ×5 IMPLANT
PACK SURGICAL SETUP 50X90 (CUSTOM PROCEDURE TRAY) ×5 IMPLANT
PAD ARMBOARD 7.5X6 YLW CONV (MISCELLANEOUS) ×10 IMPLANT
PATCH VASC XENOSURE 1CMX6CM (Vascular Products) ×2 IMPLANT
PATCH VASC XENOSURE 1X6 (Vascular Products) ×3 IMPLANT
SET COLLECT BLD 21X3/4 12 PB (MISCELLANEOUS) ×5 IMPLANT
SLEEVE SURGEON STRL (DRAPES) ×5 IMPLANT
STOPCOCK 4 WAY LG BORE MALE ST (IV SETS) ×5 IMPLANT
SUT ETHILON 3 0 PS 1 (SUTURE) ×5 IMPLANT
SUT PROLENE 6 0 BV (SUTURE) ×40 IMPLANT
SUT VIC AB 4-0 PS2 27 (SUTURE) ×5 IMPLANT
SUT VICRYL 4-0 PS2 18IN ABS (SUTURE) ×5 IMPLANT
SYR 10ML LL (SYRINGE) ×5 IMPLANT
SYR 20CC LL (SYRINGE) ×15 IMPLANT
SYR 5ML LL (SYRINGE) ×10 IMPLANT
SYR CONTROL 10ML LL (SYRINGE) ×5 IMPLANT
TOWEL GREEN STERILE (TOWEL DISPOSABLE) ×10 IMPLANT
TOWEL GREEN STERILE FF (TOWEL DISPOSABLE) ×5 IMPLANT
TUBING EXTENTION W/L.L. (IV SETS) ×5 IMPLANT
WATER STERILE IRR 1000ML POUR (IV SOLUTION) ×5 IMPLANT

## 2017-03-08 NOTE — H&P (Signed)
Patient name: Tricia Simmons MRN: [id number] DOB: 07-12-1990 Sex: female   REASON FOR CONSULT:    Clotted left forearm AVF. Consult is requested by ED.   HPI:   Tricia Simmons is a pleasant 27 y.o. female,  Who had a left RC AVF placed 2 years ago elsewhere. In March pm 2018 Dr. Bridgett Larsson did a venogram and venoplasty. This showed stenosis at the antecubital level which was ballooned, but there remained some residual stenosis. The cephalic vein emptied into the basilic system.  She dialyses on MWF. AVF clotted Wednesday. She presented to ED last PM with a clotted graft. They discussed the case with renal who was going to arrange thrombolysis but she refused. She presents today for thrombectomy and possible catheter.   Past Medical History:  Diagnosis Date  . Hypertension   . Renal disorder     No family history on file.  SOCIAL HISTORY: Social History   Socioeconomic History  . Marital status: Single    Spouse name: Not on file  . Number of children: Not on file  . Years of education: Not on file  . Highest education level: Not on file  Social Needs  . Financial resource strain: Not on file  . Food insecurity - worry: Not on file  . Food insecurity - inability: Not on file  . Transportation needs - medical: Not on file  . Transportation needs - non-medical: Not on file  Occupational History  . Not on file  Tobacco Use  . Smoking status: Never Smoker  . Smokeless tobacco: Never Used  Substance and Sexual Activity  . Alcohol use: No  . Drug use: No  . Sexual activity: Yes  Other Topics Concern  . Not on file  Social History Narrative  . Not on file    Allergies  Allergen Reactions  . Bee Venom Anaphylaxis  . Ibuprofen Other (See Comments)    "can not take due to kidneys"    No current facility-administered medications for this encounter.     REVIEW OF SYSTEMS:  [X]  denotes positive finding, [ ]  denotes negative finding Cardiac  Comments:  Chest pain or chest  pressure:    Shortness of breath upon exertion:    Short of breath when lying flat:    Irregular heart rhythm:        Vascular    Pain in calf, thigh, or hip brought on by ambulation:    Pain in feet at night that wakes you up from your sleep:     Blood clot in your veins:    Leg swelling:         Pulmonary    Oxygen at home:    Productive cough:     Wheezing:         Neurologic    Sudden weakness in arms or legs:     Sudden numbness in arms or legs:     Sudden onset of difficulty speaking or slurred speech:    Temporary loss of vision in one eye:     Problems with dizziness:         Gastrointestinal    Blood in stool:     Vomited blood:         Genitourinary    Burning when urinating:     Blood in urine:        Psychiatric    Major depression:         Hematologic    Bleeding problems:  Problems with blood clotting too easily:        Skin    Rashes or ulcers:        Constitutional    Fever or chills:     PHYSICAL EXAM:   Vitals:   03/08/17 1043  BP: (!) 155/100  Pulse: 65  Resp: 18  Temp: 97.9 F (36.6 C)  TempSrc: Oral  SpO2: 100%  Weight: 165 lb (74.8 kg)  Height: 5\' 5"  (1.651 m)    GENERAL: The patient is a well-nourished female, in no acute distress. The vital signs are documented above. CARDIAC: There is a regular rate and rhythm.  VASCULAR: Clotted left forearm AVF PULMONARY: There is good air exchange bilaterally without wheezing or rales. NEUROLOGIC: No focal weakness or paresthesias are detected. SKIN: There are no ulcers or rashes noted. PSYCHIATRIC: The patient has a normal affect.  DATA:    Fistulogram 04/2016 results above.   MEDICAL ISSUES:   CLOTTED LEFT FOREARM AVF: Patient is only agreeable to thrombectomy of AVF.  I have explained that I have multiple concerns about her left forearm AV fistula.  This is been in for a couple of years and is degenerative with some large aneurysmal segments.  In addition she had significant  sclerosis in the veins of the antecubital level back in March 2018.  I suspect that the graft clotted because of outflow problems.  She is not amenable to considering new access at this point.  She "does not want any further surgery on her arm.  I have explained that I am not optimistic that we will be able to salvage her fistula but that certainly I will try to thrombectomize the fistula and revise it in any way possible to keep it functioning.  However if we are not able to maintain patency of the fistula I have explained that she certainly will need a catheter for dialysis.  She absolutely refuses to consider this.  We have had a long conversation about this and I have explained that it would be a waste of resources to have her come back tomorrow or over the weekend to have a catheter placed however she absolutely refuses to have a catheter placed at this point.  Therefore her only option is to try to thrombectomize the fistula.  Deitra Mayo [facility] [phone]

## 2017-03-08 NOTE — Discharge Instructions (Signed)
Go to short stay at 10 am.  Nothing to eat or drink.

## 2017-03-08 NOTE — Discharge Instructions (Signed)
[facility]  Discharge Instructions  AV Fistula or Graft Surgery for Dialysis Access  Please refer to the following instructions for your post-procedure care. Your surgeon or physician assistant will discuss any changes with you.  Activity  You may drive the day following your surgery, if you are comfortable and no longer taking prescription pain medication. Resume full activity as the soreness in your incision resolves.  Bathing/Showering  You may shower after you go home. Keep your incision dry for 48 hours. Do not soak in a bathtub, hot tub, or swim until the incision heals completely. You may not shower if you have a hemodialysis catheter.  Incision Care  Clean your incision with mild soap and water after 48 hours. Pat the area dry with a clean towel. You do not need a bandage unless otherwise instructed. Do not apply any ointments or creams to your incision. You may have skin glue on your incision. Do not peel it off. It will come off on its own in about one week. Your arm may swell a bit after surgery. To reduce swelling use pillows to elevate your arm so it is above your heart. Your doctor will tell you if you need to lightly wrap your arm with an ACE bandage.  Diet  Resume your normal diet. There are not special food restrictions following this procedure. In order to heal from your surgery, it is CRITICAL to get adequate nutrition. Your body requires vitamins, minerals, and protein. Vegetables are the best source of vitamins and minerals. Vegetables also provide the perfect balance of protein. Processed food has little nutritional value, so try to avoid this.  Medications  Resume taking all of your medications. If your incision is causing pain, you may take over-the counter pain relievers such as acetaminophen (Tylenol). If you were prescribed a stronger pain medication, please be aware these medications can cause nausea and constipation. Prevent  nausea by taking the medication with a snack or meal. Avoid constipation by drinking plenty of fluids and eating foods with high amount of fiber, such as fruits, vegetables, and grains. Do not take Tylenol if you are taking prescription pain medications.  Follow up Your surgeon may want to see you in the office following your access surgery. If so, this will be arranged at the time of your surgery.  Please call us immediately for any of the following conditions:  Increased pain, redness, drainage (pus) from your incision site Fever of 101 degrees or higher Severe or worsening pain at your incision site Hand pain or numbness.  Reduce your risk of vascular disease:  Stop smoking. If you would like help, call [facility] at 1-800-QUIT-NOW (706)307-4338) or [place] [facility] [place] your cholesterol Maintain a desired weight Control your diabetes Keep your blood pressure down  Dialysis  It will take several weeks to several months for your new dialysis access to be ready for use. Your surgeon will determine when it is OK to use it. Your nephrologist will continue to direct your dialysis. You can continue to use your Permcath until your new access is ready for use.   03/08/2017 Tricia Simmons [id number] 21-May-1990  Surgeon(s): Angelia Mould, MD  Procedure(s): THROMBECTOMY ARTERIOVENOUS FISTULA INTRA OPERATIVE ARTERIOGRAM PATCH ANGIOPLASTY LEFT ARTERIOVENOUS FISTULA   x May stick graft on designated area only:  May stick below lower incision and  between both incisions. Do NOT stick over incisions.  SEE DIAGRAM.   If you have any questions,  please call the office at [phone].

## 2017-03-08 NOTE — Anesthesia Procedure Notes (Signed)
Procedure Name: LMA Insertion Date/Time: 03/08/2017 1:25 PM Performed by: Scheryl Darter, CRNA Pre-anesthesia Checklist: Patient identified Patient Re-evaluated:Patient Re-evaluated prior to induction Oxygen Delivery Method: Circle System Utilized Preoxygenation: Pre-oxygenation with 100% oxygen Induction Type: IV induction Ventilation: Mask ventilation without difficulty LMA: LMA inserted LMA Size: 4.0 Number of attempts: 1 Airway Equipment and Method: Bite block Placement Confirmation: positive ETCO2 Tube secured with: Tape Dental Injury: Teeth and Oropharynx as per pre-operative assessment

## 2017-03-08 NOTE — Op Note (Signed)
    NAME: Tricia Simmons    MRN: [id number] DOB: 12-17-1990    DATE OF OPERATION: 03/08/2017  PREOP DIAGNOSIS:    Clotted left forearm AV fistula  POSTOP DIAGNOSIS:    Same  PROCEDURE:    Thrombectomy of left forearm AV fistula with revision Bovine pericardial patch angioplasty of 2 areas with a vein graft stenosis Intraoperative fistulogram  SURGEON: Judeth Cornfield. Scot Dock, MD, FACS  ASSIST: Leontine Locket, PA  ANESTHESIA: General  EBL: Minimal  INDICATIONS:    Tricia Simmons is a 27 y.o. female who presented with a clotted left forearm AV fistula.  This had been placed elsewhere a couple years ago.  The patient had undergone a venoplasty in March 2018 and had significant intimal hyperplasia at the antecubital level.  The forearm cephalic vein empties into the basilic system.  The patient had refused thrombolysis and therefore presents for thrombectomy of her fistula.  Of note at the time, I was concerned that we may not be able to salvage the fistula and she absolutely refused to have a catheter placed at that time.  FINDINGS:   Patient underwent successful thrombectomy and vein patch angioplasty of 2 areas of stenosis in the fistula.  TECHNIQUE:   Patient was taken to the operating room and sedated by anesthesia.  The left upper extremity was prepped and draped in usual sterile fashion.  After the skin was anesthetized with 1% lidocaine the incision in the distal forearm was made over the fistula where the pulse stopped.  I felt it was likely a stenosis in this area.  The fistula was aneurysmal proximal and distal to this.  This area of the fistula was dissected free and was degenerative.  It was dissected free through dense scar tissue in the fistula was entered.  The thrombectomy was performed using a #4 Fogarty catheter until no further clot was retrieved proximally.  There was excellent inflow.  It was clamped after being flushed with heparinized saline.  Distally the Fogarty  catheter would not advanced.  I repaired the defect in the vein at this area with a bovine pericardial patch.  I then shot a intraoperative fistulogram which shows narrowing at the antecubital level as had been previously seen on the fistulogram.  I felt the only remaining option was to patch this area.  After the skin was anesthetized, a longitudinal incision was made over this area and here the vein was dissected free.  There were multiple branches and this ultimately emptied into a small basilic vein.  The upper arm cephalic vein was occluded.  The patient was redosed with heparin.  The fistula was occluded proximally distally and a longitudinal venotomy was made.  A bovine pericardial patch was then sewn using continuous 6-0 Prolene suture.  At the completion there was a reasonable thrill in the fistula although it was still somewhat pulsatile.  Hemostasis was obtained in the wounds.  Each of the wounds was closed with a deep layer of 3-0 Vicryl the skin closed with 4-0 Vicryl.  Dermabond was applied.  Patient tolerated the procedure well was transferred to the recovery room in stable condition.  All needle and sponge counts were correct.  Deitra Mayo, MD, FACS [facility]  DATE OF DICTATION:   03/08/2017

## 2017-03-08 NOTE — Anesthesia Postprocedure Evaluation (Signed)
Anesthesia Post Note  Patient: Tricia Simmons  Procedure(s) Performed: THROMBECTOMY ARTERIOVENOUS FISTULA (Left Arm Lower) INTRA OPERATIVE ARTERIOGRAM (Left Arm Upper) PATCH ANGIOPLASTY LEFT ARTERIOVENOUS FISTULA TIMES TWO (Left Arm Lower)     Patient location during evaluation: PACU Anesthesia Type: General Level of consciousness: sedated and patient cooperative Pain management: pain level controlled Vital Signs Assessment: post-procedure vital signs reviewed and stable Respiratory status: spontaneous breathing Cardiovascular status: stable Anesthetic complications: no    Last Vitals:  Vitals:   03/08/17 1442 03/08/17 1457  BP: 130/87 123/86  Pulse: 73 76  Resp: 13 19  Temp:    SpO2: 100% 100%    Last Pain:  Vitals:   03/08/17 1500  TempSrc:   PainSc: 0-No pain                 Nolon Nations

## 2017-03-08 NOTE — ED Provider Notes (Addendum)
[place] EMERGENCY DEPARTMENT Provider Note   CSN: [id number] Arrival date & time: 03/07/17  1813     History   Chief Complaint Chief Complaint  Patient presents with  . Vascular Access Problem    HPI Tricia Simmons is a 27 y.o. female.  The history is provided by the patient.  Illness  This is a new problem. The current episode started 2 days ago. The problem occurs constantly. The problem has not changed since onset.Pertinent negatives include no chest pain, no headaches and no shortness of breath. Nothing aggravates the symptoms. Nothing relieves the symptoms. She has tried nothing for the symptoms. The treatment provided no relief.  MWF dialysis patient with pain in her left forearm fistula since Wednesday.  It feels harder to the patient and she noted a slight red area.    Past Medical History:  Diagnosis Date  . Hypertension   . Renal disorder     Patient Active Problem List   Diagnosis Date Noted  . ESRD on dialysis [facility]) 04/26/2016    Past Surgical History:  Procedure Laterality Date  . A/V FISTULAGRAM Left 04/26/2016   Procedure: A/V Fistulagram;  Surgeon: Conrad Washington Park, MD;  Location: [place] [facility];  Service: Cardiovascular;  Laterality: Left;  . DG AV DIALYSIS  SHUNT ACCESS EXIST*L* OR Left 11/13/2014  . PERIPHERAL VASCULAR BALLOON ANGIOPLASTY Left 04/26/2016   Procedure: Peripheral Vascular Balloon Angioplasty;  Surgeon: Conrad Templeton, MD;  Location: [place] [facility];  Service: Cardiovascular;  Laterality: Left;  Arm Fistula    OB History    Gravida Para Term Preterm AB Living   1         1   SAB TAB Ectopic Multiple Live Births                   Home Medications    Prior to Admission medications   Medication Sig Start Date End Date Taking? Authorizing Provider  amLODipine (NORVASC) 10 MG tablet Take 10 mg by mouth at bedtime. 09/13/15   [provider]  calcium acetate (PHOSLO) 667 MG capsule Take 667-1,334 mg by mouth  See admin instructions. 2 capsules before a meal and 1 capsule before a snack - up to 9 capsules daily    [provider]  labetalol (NORMODYNE) 200 MG tablet Take 400 mg by mouth 2 (two) times daily.     [provider]  lisinopril (PRINIVIL,ZESTRIL) 20 MG tablet Take 20 mg by mouth at bedtime. 10/20/15   [provider]  metroNIDAZOLE (FLAGYL) 500 MG tablet Take 1 tablet (500 mg total) by mouth 2 (two) times daily. 05/23/16   Barnet Glasgow, NP  SENSIPAR 60 MG tablet Take 60 mg by mouth daily after supper. 10/21/15   [provider]  topiramate (TOPAMAX) 25 MG tablet Take 1 tablet (25 mg total) by mouth 2 (two) times daily. Take 2 tablets (50mg  total) by mouth twice daily for one week. Then increase dose to 3 tablets (75mg  total) twice daily for one week. Then increase dose to 4 tablets (100mg  total). 05/25/16   Ward, Ozella Almond, PA-C  valACYclovir (VALTREX) 1000 MG tablet Take 1,000 mg by mouth daily. 10/20/15   [provider]    Family History History reviewed. No pertinent family history.  Social History Social History   Tobacco Use  . Smoking status: Never Smoker  . Smokeless tobacco: Never Used  Substance Use Topics  . Alcohol use: No  .  Drug use: No     Allergies   Patient has no known allergies.   Review of Systems Review of Systems  Constitutional: Negative for fever.  Respiratory: Negative for choking, chest tightness and shortness of breath.   Cardiovascular: Negative for chest pain and leg swelling.  Musculoskeletal: Positive for arthralgias.  Neurological: Negative for headaches.  All other systems reviewed and are negative.    Physical Exam Updated Vital Signs BP (!) 180/116   Pulse 64   Temp 99 F (37.2 C) (Oral)   Resp 18   LMP 03/07/2017   SpO2 100%   Physical Exam  Constitutional: She appears well-developed and well-nourished. No distress.  HENT:  Head: Normocephalic and atraumatic.  Nose: Nose normal.   Mouth/Throat: No oropharyngeal exudate.  Eyes: Conjunctivae are normal. Pupils are equal, round, and reactive to light.  Neck: Normal range of motion. Neck supple.  Cardiovascular: Normal rate, regular rhythm, normal heart sounds and intact distal pulses.  Pulmonary/Chest: Effort normal and breath sounds normal. No stridor. She has no wheezes. She has no rales.  Abdominal: Soft. Bowel sounds are normal. She exhibits no mass. There is no tenderness. There is no rebound and no guarding.  Musculoskeletal:       Arms: Neurological: She is alert.  Skin: Skin is warm and dry. Capillary refill takes less than 2 seconds.  Psychiatric: She has a normal mood and affect.     ED Treatments / Results  Labs (all labs ordered are listed, but only abnormal results are displayed)  Results for orders placed or performed during the hospital encounter of 03/08/17  I-stat chem 8, ed  Result Value Ref Range   Sodium 137 135 - 145 mmol/L   Potassium 5.3 (H) 3.5 - 5.1 mmol/L   Chloride 101 101 - 111 mmol/L   BUN 42 (H) 6 - 20 mg/dL   Creatinine, Ser 12.30 (H) 0.44 - 1.00 mg/dL   Glucose, Bld 85 65 - 99 mg/dL   Calcium, Ion 1.16 1.15 - 1.40 mmol/L   TCO2 26 22 - 32 mmol/L   Hemoglobin 13.6 12.0 - 15.0 g/dL   HCT 40.0 36.0 - 46.0 %   No results found.    Procedures Procedures (including critical care time)  Case d/w Dr. Scot Dock, call nephrology  Case d/w Dr. Lorrene Reid.  Go to dialysis center in am for clot removal.   Antibiotics are acceptable, use warm compresses and ibuprofen..  Called back, patient spoke with nephrology and she is refusing to allow CK team to declot.  Please call vascular  219 case d/w Dr. Scot Dock come to short stay 10 am and they will place access.  NPO after midnight.  May be discharged    Final Clinical Impressions(s) / ED Diagnoses   Go to short stay at 10 am, state EDP spoke with Dr. Scot Dock regarding your access problem.  Nothing to eat or drink.  Take all antibiotics,  use warm compresses.   Return for chest pain shortness of breath,   fevers > 100.4 unrelieved by medication, shortness of breath, intractable vomiting, or diarrhea, abdominal pain, Inability to tolerate liquids or food, cough, altered mental status or any concerns. No signs of systemic illness or infection. The patient is nontoxic-appearing on exam and vital signs are within normal limits.    I have reviewed the triage vital signs and the nursing notes. Pertinent labs &imaging results that were available during my care of the patient were reviewed by me and considered in my  medical decision making (see chart for details).  After history, exam, and medical workup I feel the patient has been appropriately medically screened and is safe for discharge home. Pertinent diagnoses were discussed with the patient. Patient was given return precautions.      Mechell Girgis, MD 03/08/17 1292    Veatrice Kells, MD 03/08/17 [place] [place]

## 2017-03-08 NOTE — Transfer of Care (Signed)
Immediate Anesthesia Transfer of Care Note  Patient: Tricia Simmons  Procedure(s) Performed: THROMBECTOMY ARTERIOVENOUS FISTULA (Left Arm Lower) INTRA OPERATIVE ARTERIOGRAM (Left Arm Upper) PATCH ANGIOPLASTY LEFT ARTERIOVENOUS FISTULA TIMES TWO (Left Arm Lower)  Patient Location: PACU  Anesthesia Type:MAC and General  Level of Consciousness: awake, alert , oriented and sedated  Airway & Oxygen Therapy: Patient Spontanous Breathing and Patient connected to nasal cannula oxygen  Post-op Assessment: Report given to RN, Post -op Vital signs reviewed and stable and Patient moving all extremities  Post vital signs: Reviewed and stable  Last Vitals:  Vitals:   03/08/17 1043 03/08/17 1427  BP: (!) 155/100   Pulse: 65   Resp: 18   Temp: 36.6 C (P) 36.9 C  SpO2: 100%     Last Pain:  Vitals:   03/08/17 1427  TempSrc:   PainSc: (P) Asleep      Patients Stated Pain Goal: 3 (16/10/96 0454)  Complications: No apparent anesthesia complications

## 2017-03-08 NOTE — Anesthesia Preprocedure Evaluation (Addendum)
Anesthesia Evaluation  Patient identified by MRN, date of birth, ID band Patient awake    Reviewed: Allergy & Precautions, NPO status , Patient's Chart, lab work & pertinent test results  Airway Mallampati: II  TM Distance: >3 FB Neck ROM: Full    Dental no notable dental hx.    Pulmonary neg pulmonary ROS,    Pulmonary exam normal breath sounds clear to auscultation       Cardiovascular hypertension, Pt. on medications and Pt. on home beta blockers Normal cardiovascular exam Rhythm:Regular Rate:Normal     Neuro/Psych negative neurological ROS     GI/Hepatic negative GI ROS, Neg liver ROS,   Endo/Other  negative endocrine ROS  Renal/GU ESRF and DialysisRenal disease     Musculoskeletal negative musculoskeletal ROS (+)   Abdominal   Peds  Hematology negative hematology ROS (+)   Anesthesia Other Findings   Reproductive/Obstetrics                            Anesthesia Physical Anesthesia Plan  ASA: III  Anesthesia Plan: General   Post-op Pain Management:    Induction: Intravenous  PONV Risk Score and Plan: 3 and Ondansetron, Dexamethasone and Propofol infusion  Airway Management Planned: Nasal Cannula and Natural Airway  Additional Equipment: None  Intra-op Plan:   Post-operative Plan: Extubation in OR  Informed Consent: I have reviewed the patients History and Physical, chart, labs and discussed the procedure including the risks, benefits and alternatives for the proposed anesthesia with the patient or authorized representative who has indicated his/her understanding and acceptance.   Dental advisory given  Plan Discussed with: CRNA  Anesthesia Plan Comments:       Anesthesia Quick Evaluation

## 2017-03-08 NOTE — Progress Notes (Signed)
Spoke with Dr. Nyoka Cowden regarding a previous K+ 5.3. Per Dr. Nyoka Cowden, that result is fine, and there is no need for a new sample. Also, anesthesia are aware of the nose and chest piercing that can not be removed.

## 2017-03-11 ENCOUNTER — Emergency Department (HOSPITAL_COMMUNITY)
Admission: EM | Admit: 2017-03-11 | Discharge: 2017-03-11 | Disposition: A | Discharge status: Home / Self Care | Payer: Managed Care, Other (non HMO) | Attending: Emergency Medicine | Admitting: Emergency Medicine

## 2017-03-11 ENCOUNTER — Other Ambulatory Visit: Payer: Self-pay

## 2017-03-11 ENCOUNTER — Encounter (HOSPITAL_COMMUNITY): Payer: Self-pay | Admitting: Vascular Surgery

## 2017-03-11 DIAGNOSIS — Z992 Dependence on renal dialysis: Secondary | ICD-10-CM | POA: Diagnosis not present

## 2017-03-11 DIAGNOSIS — Z4889 Encounter for other specified surgical aftercare: Secondary | ICD-10-CM | POA: Diagnosis not present

## 2017-03-11 DIAGNOSIS — Z789 Other specified health status: Secondary | ICD-10-CM | POA: Diagnosis present

## 2017-03-11 DIAGNOSIS — N186 End stage renal disease: Secondary | ICD-10-CM | POA: Insufficient documentation

## 2017-03-11 DIAGNOSIS — I120 Hypertensive chronic kidney disease with stage 5 chronic kidney disease or end stage renal disease: Secondary | ICD-10-CM | POA: Diagnosis not present

## 2017-03-11 DIAGNOSIS — Z79899 Other long term (current) drug therapy: Secondary | ICD-10-CM | POA: Diagnosis not present

## 2017-03-11 DIAGNOSIS — Z91030 Bee allergy status: Secondary | ICD-10-CM | POA: Diagnosis not present

## 2017-03-11 NOTE — ED Provider Notes (Signed)
[place] EMERGENCY DEPARTMENT Provider Note   CSN: [id number] Arrival date & time: 03/11/17  1358     History   Chief Complaint Chief Complaint  Patient presents with  . Vascular Access Problem    HPI Tricia Simmons is a 27 y.o. female.  HPI   27 year old female with history of complicated AV fistula presents today with complications to her fistula.  Patient had this placed at an outside facility several years ago.  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  She had refused thrombolysis at the kidney center and presented for thrombectomy to Dr. Scot Dock.  There was concern that this would not be salvageable but patient was refusing catheter placement.  Patient eventually underwent successful thrombectomy and vein patch angioplasty within 2 areas of stenosis in the fistula.  Patient notes this was done on 03/08/2017 approximately 3 days ago.  She notes that she went to have dialysis today, she was cannulated at the distal portion without complication, was attempting cannulation at the proximal portion and had infiltration.  She notes that they are was marked and patient was referred to [facility] tomorrow for catheter placement.  Patient notes that she had very little discomfort, no significant swelling since the initial swelling, no change in redness or significant distal neurological deficits.  Patient reports she has had some fullness in her lower extremities, but denies any acute shortness of breath.    Past Medical History:  Diagnosis Date  . ESRD (end stage renal disease) ([place])    M-W-F  . Hypertension   . Renal disorder     Patient Active Problem List   Diagnosis Date Noted  . ESRD on dialysis [facility]) 04/26/2016    Past Surgical History:  Procedure Laterality Date  . A/V FISTULAGRAM Left 04/26/2016   Procedure: A/V Fistulagram;  Surgeon: Conrad [place], MD;  Location: [place] [facility];  Service:  Cardiovascular;  Laterality: Left;  . DG AV DIALYSIS  SHUNT ACCESS EXIST*L* OR Left 11/13/2014  . INTRAOPERATIVE ARTERIOGRAM Left 03/08/2017   Procedure: INTRA OPERATIVE ARTERIOGRAM;  Surgeon: Angelia Mould, MD;  Location: [place];  Service: Vascular;  Laterality: Left;  . PATCH ANGIOPLASTY Left 03/08/2017   Procedure: PATCH ANGIOPLASTY LEFT ARTERIOVENOUS FISTULA TIMES TWO;  Surgeon: Angelia Mould, MD;  Location: [place];  Service: Vascular;  Laterality: Left;  . PERIPHERAL VASCULAR BALLOON ANGIOPLASTY Left 04/26/2016   Procedure: Peripheral Vascular Balloon Angioplasty;  Surgeon: Conrad [place], MD;  Location: [place] [facility];  Service: Cardiovascular;  Laterality: Left;  Arm Fistula  . THROMBECTOMY W/ EMBOLECTOMY Left 03/08/2017   Procedure: THROMBECTOMY ARTERIOVENOUS FISTULA;  Surgeon: Angelia Mould, MD;  Location: [facility] OR;  Service: Vascular;  Laterality: Left;    OB History    Gravida Para Term Preterm AB Living   1         1   SAB TAB Ectopic Multiple Live Births                   Home Medications    Prior to Admission medications   Medication Sig Start Date End Date Taking? Authorizing Provider  amLODipine (NORVASC) 10 MG tablet Take 10 mg by mouth at bedtime. 09/13/15   [provider]  calcium acetate (PHOSLO) 667 MG capsule Take 667-1,334 mg by mouth See admin instructions. 2 capsules before a meal and 1 capsule before a snack - up to 9 capsules daily  [provider]  cephALEXin (KEFLEX) 500 MG capsule Take 1 capsule (500 mg total) by mouth 4 (four) times daily. 03/08/17   Palumbo, April, MD  doxycycline (VIBRAMYCIN) 100 MG capsule Take 1 capsule (100 mg total) by mouth 2 (two) times daily. One po bid x 7 days 03/08/17   Palumbo, April, MD  labetalol (NORMODYNE) 200 MG tablet Take 400 mg by mouth 2 (two) times daily.     [provider]  lisinopril (PRINIVIL,ZESTRIL) 20 MG tablet Take 20 mg by mouth at bedtime. 10/20/15   [provider]  oxyCODONE-acetaminophen (PERCOCET) 5-325 MG tablet Take 1 tablet by mouth every 6 (six) hours as needed for severe pain. 03/08/17   Rhyne, Samantha J, PA-C  SENSIPAR 60 MG tablet Take 60 mg by mouth daily after supper. 10/21/15   [provider]  topiramate (TOPAMAX) 25 MG tablet Take 1 tablet (25 mg total) by mouth 2 (two) times daily. Take 2 tablets (50mg  total) by mouth twice daily for one week. Then increase dose to 3 tablets (75mg  total) twice daily for one week. Then increase dose to 4 tablets (100mg  total). Patient not taking: Reported on 03/08/2017 05/25/16   Ward, Ozella Almond, PA-C    Family History History reviewed. No pertinent family history.  Social History Social History   Tobacco Use  . Smoking status: Never Smoker  . Smokeless tobacco: Never Used  Substance Use Topics  . Alcohol use: No  . Drug use: No     Allergies   Bee venom and Ibuprofen   Review of Systems Review of Systems  All other systems reviewed and are negative.    Physical Exam Updated Vital Signs BP (!) 130/93 (BP Location: Right Arm)   Pulse (!) 57   Temp 98.3 F (36.8 C) (Oral)   Resp 14   Ht 5\' 5"  (1.651 m)   Wt 79.4 kg (175 lb 0.7 oz)   LMP 03/07/2017   SpO2 96%   BMI 29.13 kg/m   Physical Exam  Constitutional: She is oriented to person, place, and time. She appears well-developed and well-nourished.  HENT:  Head: Normocephalic and atraumatic.  Eyes: Conjunctivae are normal. Pupils are equal, round, and reactive to light. Right eye exhibits no discharge. Left eye exhibits no discharge. No scleral icterus.  Neck: Normal range of motion. No JVD present. No tracheal deviation present.  Pulmonary/Chest: Effort normal. No stridor.  Musculoskeletal:  Distal sensation intact, radial pulse 2+-no significant warmth to the swollen area  Neurological: She is alert and oriented to person, place, and time. Coordination normal.  Psychiatric: She has a normal mood and  affect. Her behavior is normal. Judgment and thought content normal.  Nursing note and vitals reviewed.      ED Treatments / Results  Labs (all labs ordered are listed, but only abnormal results are displayed) Labs Reviewed  COMPREHENSIVE METABOLIC PANEL  CBC WITH DIFFERENTIAL/PLATELET  I-STAT CG4 LACTIC ACID, ED  I-STAT BETA HCG BLOOD, ED ([facility], [facility], [facility] ONLY)    EKG  EKG Interpretation None       Radiology No results found.  Procedures Procedures (including critical care time)  Medications Ordered in ED Medications - No data to display   Initial Impression / Assessment and Plan / ED Course  I have reviewed the triage vital signs and the nursing notes.  Pertinent labs & imaging results that were available during my care of the patient were reviewed by me and considered in my medical decision  making (see chart for details).        Final Clinical Impressions(s) / ED Diagnoses   Final diagnoses:  Problem with vascular access    Labs:   Imaging:  Consults:  Therapeutics:  Discharge Meds:    Assessment/Plan: 27 year old female presents today with infiltration after attempted cannulation.  Patient has not had any significant swelling since the initial event, she has no signs of compartment syndrome, continues to have warm well perfused distal extremity.  I see no infectious etiology.  Patient has close follow-up tomorrow at the [facility].  I find this appropriate for her to follow-up as an outpatient.  Patient has no acute complaints including shortness of breath or chest pain I feel she is stable for outpatient follow-up.  Patient was evaluated by attending physician who agrees to assessment and plan.      ED Discharge Orders    None       Okey Regal, Hershal Coria 03/11/17 1458

## 2017-03-11 NOTE — ED Triage Notes (Signed)
Pt states she has new AV fistula in her left arm. She has not had dialysis since last Wednesday. Pt is a MWF patient. She reports last week she had a clot in her graft, was having pain today in the fistula and wants to have it checked.

## 2017-03-11 NOTE — Discharge Instructions (Signed)
Please read attached information. If you experience any new or worsening signs or symptoms please return to the emergency room for evaluation. Please follow-up with your primary care provider or specialist as discussed.  °

## 2017-03-11 NOTE — ED Notes (Signed)
Patient seen and discharged from triage area by [facility]

## 2017-03-11 NOTE — ED Provider Notes (Signed)
Medical screening examination/treatment/procedure(s) were conducted as a shared visit with non-physician practitioner(s) and myself.  I personally evaluated the patient during the encounter. Briefly, the patient is a 27 y.o. female here with a history of ESRD on dialysis with left forearm AV fistula who has had stenosis of the AV fistula that underwent a successful thrombectomy on January 25,  presents to the ED from dialysis center inability to cannulate the AV fistula.  Given her recurrent issues with the AV fistula, vascular surgery recommended catheter placement for which she has an appointment at [facility] tomorrow.  She was sent here due to pain after decannulation of the AV fistula.  They also noted some mild swelling.  The patient endorses pain at the site of cannulation.  Denies any numbness or weakness of the hand.  Patient does have associated paresthesia related to the recent thrombectomy which is unchanged.  She denies any other physical complaints.  On exam patient has some mild swelling near the cannulation site which is tender to palpation.  She has a good thrill of the AV fistula.  She has surrounding resolving ecchymosis which is likely secondary to the thrombectomy performed on the 25th.  The swelling was marked previously to arrival and it appears to be stable.  Do not feel that vascular ultrasound is necessary at this time.  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  Recommended close monitoring and follow-up with vascular surgery.   The patient is safe for discharge with strict return precautions.    EKG Interpretation None           Marbin Olshefski, Grayce Sessions, MD 03/11/17 1858

## 2017-03-13 LAB — CULTURE, BLOOD (ROUTINE X 2)
CULTURE: NO GROWTH
CULTURE: NO GROWTH
SPECIAL REQUESTS: ADEQUATE
Special Requests: ADEQUATE

## 2017-03-18 ENCOUNTER — Encounter (HOSPITAL_COMMUNITY): Payer: Self-pay | Admitting: *Deleted

## 2017-03-18 ENCOUNTER — Non-Acute Institutional Stay (HOSPITAL_COMMUNITY)
Admission: EM | Admit: 2017-03-18 | Discharge: 2017-03-19 | Disposition: A | Discharge status: Home / Self Care | Payer: Managed Care, Other (non HMO) | Attending: Emergency Medicine | Admitting: Emergency Medicine

## 2017-03-18 ENCOUNTER — Emergency Department (HOSPITAL_COMMUNITY): Payer: Managed Care, Other (non HMO)

## 2017-03-18 ENCOUNTER — Other Ambulatory Visit: Payer: Self-pay

## 2017-03-18 DIAGNOSIS — I120 Hypertensive chronic kidney disease with stage 5 chronic kidney disease or end stage renal disease: Secondary | ICD-10-CM | POA: Diagnosis not present

## 2017-03-18 DIAGNOSIS — R519 Headache, unspecified: Secondary | ICD-10-CM

## 2017-03-18 DIAGNOSIS — R51 Headache: Secondary | ICD-10-CM | POA: Diagnosis present

## 2017-03-18 DIAGNOSIS — N185 Chronic kidney disease, stage 5: Secondary | ICD-10-CM

## 2017-03-18 DIAGNOSIS — Z79899 Other long term (current) drug therapy: Secondary | ICD-10-CM | POA: Diagnosis not present

## 2017-03-18 DIAGNOSIS — N186 End stage renal disease: Secondary | ICD-10-CM | POA: Insufficient documentation

## 2017-03-18 DIAGNOSIS — R079 Chest pain, unspecified: Secondary | ICD-10-CM | POA: Insufficient documentation

## 2017-03-18 DIAGNOSIS — D631 Anemia in chronic kidney disease: Secondary | ICD-10-CM

## 2017-03-18 DIAGNOSIS — Z992 Dependence on renal dialysis: Secondary | ICD-10-CM | POA: Diagnosis not present

## 2017-03-18 LAB — I-STAT TROPONIN, ED: Troponin i, poc: 0.04 ng/mL (ref 0.00–0.08)

## 2017-03-18 LAB — BASIC METABOLIC PANEL
Anion gap: 10 (ref 5–15)
BUN: 60 mg/dL — AB (ref 6–20)
CALCIUM: 9.6 mg/dL (ref 8.9–10.3)
CO2: 20 mmol/L — ABNORMAL LOW (ref 22–32)
CREATININE: 14.85 mg/dL — AB (ref 0.44–1.00)
Chloride: 107 mmol/L (ref 101–111)
GFR calc Af Amer: 3 mL/min — ABNORMAL LOW (ref >60)
GFR, EST NON AFRICAN AMERICAN: 3 mL/min — AB (ref >60)
GLUCOSE: 99 mg/dL (ref 65–99)
POTASSIUM: 6.4 mmol/L — AB (ref 3.5–5.1)
SODIUM: 137 mmol/L (ref 135–145)

## 2017-03-18 LAB — CBC
HEMATOCRIT: 31.1 % — AB (ref 36.0–46.0)
Hemoglobin: 9.8 g/dL — ABNORMAL LOW (ref 12.0–15.0)
MCH: 30.4 pg (ref 26.0–34.0)
MCHC: 31.5 g/dL (ref 30.0–36.0)
MCV: 96.6 fL (ref 78.0–100.0)
PLATELETS: 164 10*3/uL (ref 150–400)
RBC: 3.22 MIL/uL — ABNORMAL LOW (ref 3.87–5.11)
RDW: 17.6 % — AB (ref 11.5–15.5)
WBC: 6 10*3/uL (ref 4.0–10.5)

## 2017-03-18 LAB — I-STAT BETA HCG BLOOD, ED (MC, WL, AP ONLY)

## 2017-03-18 MED ORDER — PENTAFLUOROPROP-TETRAFLUOROETH EX AERO
1.0000 "application " | INHALATION_SPRAY | CUTANEOUS | Status: DC | PRN
  Filled 2017-03-18: qty 30

## 2017-03-18 MED ORDER — SODIUM CHLORIDE 0.9 % IV SOLN: 100.0000 mL | INTRAVENOUS | Status: DC | PRN

## 2017-03-18 MED ORDER — LIDOCAINE HCL (PF) 1 % IJ SOLN: 5.0000 mL | INTRAMUSCULAR | Status: DC | PRN

## 2017-03-18 MED ORDER — LIDOCAINE-PRILOCAINE 2.5-2.5 % EX CREA
1.0000 "application " | TOPICAL_CREAM | CUTANEOUS | Status: DC | PRN
  Filled 2017-03-18: qty 5

## 2017-03-18 MED ORDER — HEPARIN SODIUM (PORCINE) 1000 UNIT/ML DIALYSIS
1000.0000 [IU] | INTRAMUSCULAR | Status: DC | PRN
  Administered 2017-03-18: 1000 [IU] via INTRAVENOUS_CENTRAL
  Filled 2017-03-18 (×2): qty 1

## 2017-03-18 MED ORDER — HEPARIN SODIUM (PORCINE) 1000 UNIT/ML DIALYSIS
20.0000 [IU]/kg | INTRAMUSCULAR | Status: DC | PRN
  Administered 2017-03-18: 1600 [IU] via INTRAVENOUS_CENTRAL
  Filled 2017-03-18 (×2): qty 2

## 2017-03-18 MED ORDER — ALTEPLASE 2 MG IJ SOLR: 2.0000 mg | Freq: Once | INTRAMUSCULAR | Status: DC | PRN

## 2017-03-18 MED ORDER — ACETAMINOPHEN 325 MG PO TABS
ORAL_TABLET | ORAL | Status: AC
  Filled 2017-03-18: qty 2

## 2017-03-18 MED ORDER — ACETAMINOPHEN 325 MG PO TABS
650.0000 mg | ORAL_TABLET | Freq: Once | ORAL | Status: AC
  Administered 2017-03-18: 650 mg via ORAL

## 2017-03-18 NOTE — Progress Notes (Signed)
HD tx completed @ 2300 w/o problem, UF goal met, blood rinsed back, VSS, however pt states she doesn't feel like it is just her K+ being high that is wrong w/ her and she feels bad, doesn't want to be d/c home like this, called the ED and spoke w/ charge nurse and explained situation, she called back and told me to have pt transported back down to the ED, waiting on transport

## 2017-03-18 NOTE — ED Notes (Signed)
Notified dr ray of abnormal lab K+ 6.4

## 2017-03-18 NOTE — Progress Notes (Signed)
HD tx initiated via HD cath w/o problem, pull/push/flush equally w/o problem, VSS, will cont to monitor while on HD tx 

## 2017-03-18 NOTE — Progress Notes (Signed)
27 year old BF - on HD for 2 years "sometimes I miss because of my daughter"  Also appears to have had some issues with access of late- s/p thrombectomy on 1/25 with missed treatments around - did go last week MWF but signed off already short time. She says starting yesterday felt very tired and with headache, then chest pain.  troponin 0.04.   Did not link feeling to missing HD.  Last HD was Friday. In ER K was 6.4- BUN 64 and crt of 14.85.  plan put in place to do 2 hour treatment this evening- get K down , then send home.  told her to call center to get worked in tomorrow as well.  BFR 400 via AVF- BP 160/100- goal of 1500.   Tricia Simmons A

## 2017-03-18 NOTE — ED Notes (Signed)
Dialysis called and report given to RN. Patient will have 2 hour dialysis treatment and then be discharged.

## 2017-03-18 NOTE — ED Triage Notes (Signed)
Pt is dialysis pt, did not go to treatment today due to headache and not feeling well. Pt tried otc meds with no relief. Came here today due to now having chest pain. Denies recent cough.

## 2017-03-18 NOTE — ED Provider Notes (Signed)
[place] EMERGENCY DEPARTMENT Provider Note   CSN: [id number] Arrival date & time: 03/18/17  1441     History   Chief Complaint Chief Complaint  Patient presents with  . Headache  . Chest Pain    HPI Tricia Simmons is a 27 y.o. female.  HPI  Patient presents with concern of nausea, chest pain, difficulty breathing. Patient is a young female on dialysis, for approximately 2 years. Last dialysis was 3 days ago, and she had a full session. The following day she developed chest pain, anterior, tight, as well as nausea, vomiting Patient has not taken pain medication, has had cold medication, without substantial change in her condition. No fever, no vomiting, no diarrhea. There is one positive sick contact. Today patient felt too unwell to go to dialysis.  Past Medical History:  Diagnosis Date  . ESRD (end stage renal disease) ([place])    M-W-F  . Hypertension   . Renal disorder     Patient Active Problem List   Diagnosis Date Noted  . ESRD on dialysis [facility]) 04/26/2016    Past Surgical History:  Procedure Laterality Date  . A/V FISTULAGRAM Left 04/26/2016   Procedure: A/V Fistulagram;  Surgeon: Conrad [place], MD;  Location: [place] [facility];  Service: Cardiovascular;  Laterality: Left;  . DG AV DIALYSIS  SHUNT ACCESS EXIST*L* OR Left 11/13/2014  . INTRAOPERATIVE ARTERIOGRAM Left 03/08/2017   Procedure: INTRA OPERATIVE ARTERIOGRAM;  Surgeon: Angelia Mould, MD;  Location: [place];  Service: Vascular;  Laterality: Left;  . PATCH ANGIOPLASTY Left 03/08/2017   Procedure: PATCH ANGIOPLASTY LEFT ARTERIOVENOUS FISTULA TIMES TWO;  Surgeon: Angelia Mould, MD;  Location: [place];  Service: Vascular;  Laterality: Left;  . PERIPHERAL VASCULAR BALLOON ANGIOPLASTY Left 04/26/2016   Procedure: Peripheral Vascular Balloon Angioplasty;  Surgeon: Conrad [place], MD;  Location: [place] [facility];  Service: Cardiovascular;  Laterality: Left;  Arm Fistula  .  THROMBECTOMY W/ EMBOLECTOMY Left 03/08/2017   Procedure: THROMBECTOMY ARTERIOVENOUS FISTULA;  Surgeon: Angelia Mould, MD;  Location: [facility];  Service: Vascular;  Laterality: Left;    OB History    Gravida Para Term Preterm AB Living   1         1   SAB TAB Ectopic Multiple Live Births                   Home Medications    Prior to Admission medications   Medication Sig Start Date End Date Taking? Authorizing Provider  amLODipine (NORVASC) 10 MG tablet Take 10 mg by mouth at bedtime. 09/13/15   [provider]  calcium acetate (PHOSLO) 667 MG capsule Take 667-1,334 mg by mouth See admin instructions. 2 capsules before a meal and 1 capsule before a snack - up to 9 capsules daily    [provider]  cephALEXin (KEFLEX) 500 MG capsule Take 1 capsule (500 mg total) by mouth 4 (four) times daily. 03/08/17   Palumbo, April, MD  doxycycline (VIBRAMYCIN) 100 MG capsule Take 1 capsule (100 mg total) by mouth 2 (two) times daily. One po bid x 7 days 03/08/17   Palumbo, April, MD  labetalol (NORMODYNE) 200 MG tablet Take 400 mg by mouth 2 (two) times daily.     [provider]  lisinopril (PRINIVIL,ZESTRIL) 20 MG tablet Take 20 mg by mouth at bedtime. 10/20/15   [provider]  oxyCODONE-acetaminophen (PERCOCET) 5-325 MG tablet Take 1 tablet by mouth every 6 (  six) hours as needed for severe pain. 03/08/17   Rhyne, Samantha J, PA-C  SENSIPAR 60 MG tablet Take 60 mg by mouth daily after supper. 10/21/15   [provider]  topiramate (TOPAMAX) 25 MG tablet Take 1 tablet (25 mg total) by mouth 2 (two) times daily. Take 2 tablets (50mg  total) by mouth twice daily for one week. Then increase dose to 3 tablets (75mg  total) twice daily for one week. Then increase dose to 4 tablets (100mg  total). Patient not taking: Reported on 03/08/2017 05/25/16   Ward, Ozella Almond, PA-C    Family History History reviewed. No pertinent family history.  Social History Social  History   Tobacco Use  . Smoking status: Never Smoker  . Smokeless tobacco: Never Used  Substance Use Topics  . Alcohol use: No  . Drug use: No     Allergies   Bee venom and Ibuprofen   Review of Systems Review of Systems  Constitutional:       Per HPI, otherwise negative  HENT:       Per HPI, otherwise negative  Respiratory:       Per HPI, otherwise negative  Cardiovascular:       Per HPI, otherwise negative  Gastrointestinal: Positive for nausea. Negative for vomiting.  Endocrine:       Negative aside from HPI  Genitourinary:       Neg aside from HPI   Musculoskeletal:       Per HPI, otherwise negative  Skin: Negative.   Allergic/Immunologic: Positive for immunocompromised state.  Neurological: Positive for weakness. Negative for syncope.     Physical Exam Updated Vital Signs BP (!) 149/100 (BP Location: Right Arm)   Pulse 90   Temp 98.7 F (37.1 C) (Oral)   Resp (!) 26   LMP 03/07/2017   SpO2 100%   Physical Exam  Constitutional: She is oriented to person, place, and time. She appears well-developed and well-nourished. No distress.  HENT:  Head: Normocephalic and atraumatic.  Eyes: Conjunctivae and EOM are normal.  Cardiovascular: Normal rate and regular rhythm.  Pulmonary/Chest: Effort normal and breath sounds normal. No stridor. No respiratory distress.    Abdominal: She exhibits no distension.  Musculoskeletal: She exhibits no edema.  Neurological: She is alert and oriented to person, place, and time. No cranial nerve deficit.  Skin: Skin is warm and dry.  Psychiatric: She has a normal mood and affect.  Nursing note and vitals reviewed.    ED Treatments / Results  Labs (all labs ordered are listed, but only abnormal results are displayed) Labs Reviewed  BASIC METABOLIC PANEL - Abnormal; Notable for the following components:      Result Value   Potassium 6.4 (*)    CO2 20 (*)    BUN 60 (*)    Creatinine, Ser 14.85 (*)    GFR calc non Af  Amer 3 (*)    GFR calc Af Amer 3 (*)    All other components within normal limits  CBC - Abnormal; Notable for the following components:   RBC 3.22 (*)    Hemoglobin 9.8 (*)    HCT 31.1 (*)    RDW 17.6 (*)    All other components within normal limits  I-STAT TROPONIN, ED  I-STAT BETA HCG BLOOD, ED ([facility], [facility], [facility] ONLY)    EKG  EKG Interpretation  Date/Time:  Monday March 18 2017 16:10:50 EST Ventricular Rate:  79 PR Interval:  160 QRS Duration: 92 QT Interval:  364 QTC Calculation: 417 R Axis:   80 Text Interpretation:  Normal sinus rhythm No significant change since last tracing unremarkable ECG Confirmed by Carmin Muskrat [phone]) on 03/18/2017 6:54:38 PM       Radiology Dg Chest 2 View  Result Date: 03/18/2017 CLINICAL DATA:  Patient missed dialysis and is not feeling well. EXAM: CHEST  2 VIEW COMPARISON:  None. FINDINGS: The dialysis catheter terminates near the caval atrial junction or just within the right side of the heart. The heart, hila, mediastinum, lungs, and pleura are otherwise unremarkable. IMPRESSION: No acute abnormalities. Electronically Signed   By: Dorise Bullion III M.D   On: 03/18/2017 17:14    Procedures Procedures (including critical care time)  Medications Ordered in ED Medications - No data to display   Initial Impression / Assessment and Plan / ED Course  I have reviewed the triage vital signs and the nursing notes.  Pertinent labs & imaging results that were available during my care of the patient were reviewed by me and considered in my medical decision making (see chart for details).    On repeat exam patient is in no distress per However, the patient has critically abnormal potassium value, 6.4. EKG similar to prior.  This young female on dialysis on have critically abnormal potassium value. I discussed patient case with our nephrology team for dialysis now. Evaluation otherwise reassuring, no evidence for concurrent infection, ongoing  coronary ischemia.   Final Clinical Impressions(s) / ED Diagnoses  Hyperkalemia Atypical chest pain Nausea   Carmin Muskrat, MD 03/19/17 0004

## 2017-03-19 ENCOUNTER — Non-Acute Institutional Stay (HOSPITAL_COMMUNITY): Payer: Managed Care, Other (non HMO)

## 2017-03-19 DIAGNOSIS — R51 Headache: Secondary | ICD-10-CM | POA: Diagnosis not present

## 2017-03-19 LAB — INFLUENZA PANEL BY PCR (TYPE A & B)
INFLAPCR: POSITIVE — AB
INFLBPCR: NEGATIVE

## 2017-03-19 LAB — HEPATITIS B SURFACE ANTIGEN: HEP B S AG: NEGATIVE

## 2017-03-19 MED ORDER — ACETAMINOPHEN 325 MG PO TABS
650.0000 mg | ORAL_TABLET | Freq: Once | ORAL | Status: AC
  Administered 2017-03-19: 650 mg via ORAL
  Filled 2017-03-19 (×2): qty 2

## 2017-03-19 MED ORDER — METOCLOPRAMIDE HCL 10 MG PO TABS: 10.0000 mg | ORAL_TABLET | Freq: Four times a day (QID) | ORAL | 0 refills | Status: AC | PRN

## 2017-03-19 MED ORDER — DIPHENHYDRAMINE HCL 50 MG/ML IJ SOLN
25.0000 mg | Freq: Once | INTRAMUSCULAR | Status: AC
  Administered 2017-03-19: 25 mg via INTRAVENOUS
  Filled 2017-03-19: qty 1

## 2017-03-19 MED ORDER — METOCLOPRAMIDE HCL 5 MG/ML IJ SOLN
10.0000 mg | Freq: Once | INTRAMUSCULAR | Status: AC
  Administered 2017-03-19: 10 mg via INTRAVENOUS
  Filled 2017-03-19: qty 2

## 2017-03-19 MED ORDER — OXYCODONE-ACETAMINOPHEN 5-325 MG PO TABS: 1.0000 | ORAL_TABLET | ORAL | 0 refills | Status: AC | PRN

## 2017-03-19 NOTE — ED Notes (Signed)
Patient returned from dialysis with headache unchanged.  Given 650 of tylenol with no change in pain.  No neuro deficits.

## 2017-03-19 NOTE — ED Provider Notes (Signed)
1:10 AM She has returned from dialysis and states she is not feeling any better.  She continues to have a headache which is frontal and radiating to the right side.  There is associated photophobia.  She states headache started yesterday and she has had fever and chills associated.  There has been a nonproductive cough and she is worried about influenza.  Acetaminophen has not helped her.  Will start an IV and give her metoclopramide and diphenhydramine.  Because she is a dialysis patient, we will not give fluids as part of the headache cocktail.  Because headache is unusual for her, we will will send her for a CT of head, but no red flags present to suggest more serious causes of headache such as meningitis or subarachnoid hemorrhage.  Will send nasal swab for influenza PCR.  3:50 AM Head CT was unremarkable.  Following medications, headache is improved but not completely gone.  Patient is anxious to leave, but flu panel is not back yet.  She is discharged with prescriptions for metoclopramide and a small number of oxycodone-acetaminophen.  Results for orders placed or performed during the hospital encounter of 11/57/26  Basic metabolic panel  Result Value Ref Range   Sodium 137 135 - 145 mmol/L   Potassium 6.4 (HH) 3.5 - 5.1 mmol/L   Chloride 107 101 - 111 mmol/L   CO2 20 (L) 22 - 32 mmol/L   Glucose, Bld 99 65 - 99 mg/dL   BUN 60 (H) 6 - 20 mg/dL   Creatinine, Ser 14.85 (H) 0.44 - 1.00 mg/dL   Calcium 9.6 8.9 - 10.3 mg/dL   GFR calc non Af Amer 3 (L) >60 mL/min   GFR calc Af Amer 3 (L) >60 mL/min   Anion gap 10 5 - 15  CBC  Result Value Ref Range   WBC 6.0 4.0 - 10.5 K/uL   RBC 3.22 (L) 3.87 - 5.11 MIL/uL   Hemoglobin 9.8 (L) 12.0 - 15.0 g/dL   HCT 31.1 (L) 36.0 - 46.0 %   MCV 96.6 78.0 - 100.0 fL   MCH 30.4 26.0 - 34.0 pg   MCHC 31.5 30.0 - 36.0 g/dL   RDW 17.6 (H) 11.5 - 15.5 %   Platelets 164 150 - 400 K/uL  I-stat troponin, ED  Result Value Ref Range   Troponin i, poc 0.04  0.00 - 0.08 ng/mL   Comment 3          I-Stat beta hCG blood, ED  Result Value Ref Range   I-stat hCG, quantitative <5.0 <5 mIU/mL   Comment 3           Dg Chest 2 View  Result Date: 03/18/2017 CLINICAL DATA:  Patient missed dialysis and is not feeling well. EXAM: CHEST  2 VIEW COMPARISON:  None. FINDINGS: The dialysis catheter terminates near the caval atrial junction or just within the right side of the heart. The heart, hila, mediastinum, lungs, and pleura are otherwise unremarkable. IMPRESSION: No acute abnormalities. Electronically Signed   By: Dorise Bullion III M.D   On: 03/18/2017 17:14   Ct Head Wo Contrast  Result Date: 03/19/2017 CLINICAL DATA:  Acute onset of headache. EXAM: CT HEAD WITHOUT CONTRAST TECHNIQUE: Contiguous axial images were obtained from the base of the skull through the vertex without intravenous contrast. COMPARISON:  MRI of the brain performed 05/24/2016 FINDINGS: Brain: No evidence of acute infarction, hemorrhage, hydrocephalus, extra-axial collection or mass lesion/mass effect. The posterior fossa, including the cerebellum, brainstem and  fourth ventricle, is within normal limits. The third and lateral ventricles, and basal ganglia are unremarkable in appearance. The cerebral hemispheres are symmetric in appearance, with normal gray-white differentiation. No mass effect or midline shift is seen. Vascular: No hyperdense vessel or unexpected calcification. Skull: There is no evidence of fracture; visualized osseous structures are unremarkable in appearance. Sinuses/Orbits: The orbits are within normal limits. The paranasal sinuses and mastoid air cells are well-aerated. Other: No significant soft tissue abnormalities are seen. IMPRESSION: Unremarkable noncontrast CT of the head. Electronically Signed   By: Garald Balding M.D.   On: 45/14/6047 99:87     Delora Fuel, MD 21/58/72 [phone]

## 2017-03-19 NOTE — ED Notes (Signed)
Pt verbalizes understanding of d/c instructions. Pt received prescriptions. Pt ambulatory at d/c with all belongings.  

## 2017-03-20 ENCOUNTER — Telehealth: Payer: Self-pay

## 2017-03-20 LAB — HEPATITIS B SURFACE ANTIBODY,QUALITATIVE: Hep B S Ab: REACTIVE

## 2017-03-20 NOTE — Telephone Encounter (Signed)
Spoke with patient regarding return to work note. I have her out until 03/15/2017, per [facility]. Letter will be at front desk for pickup. Patient agrees to this.

## 2018-02-07 IMAGING — CT CT HEAD W/O CM
4 series · 16 of 47 positions shown, 18 images · non-contrast
Comparison: MRI of the brain performed 05/24/2016

CLINICAL DATA: Acute onset of headache.

EXAM:
CT HEAD WITHOUT CONTRAST
TECHNIQUE: Contiguous axial images were obtained from the base of the skull
through the vertex without intravenous contrast.

[Series 3: head without · axial · non-contrast · 0.44mm/px · z∈[-54,+66]mm · 7 of 33 slices shown, 9 images]
[im 5/33  brain]
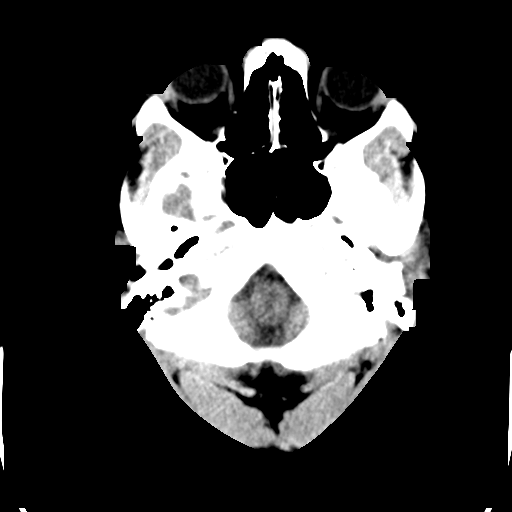
[im 5/33  bone]
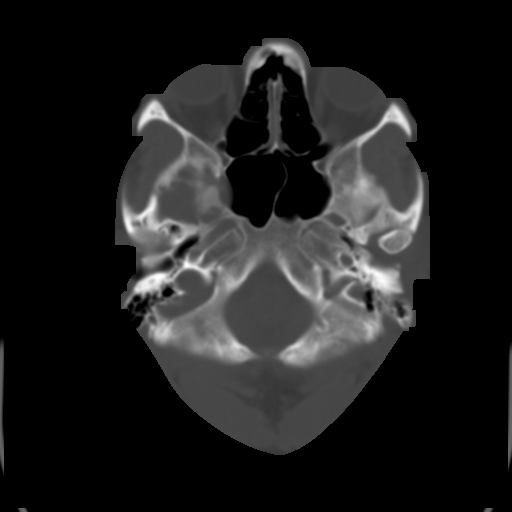
[im 9/33  brain]
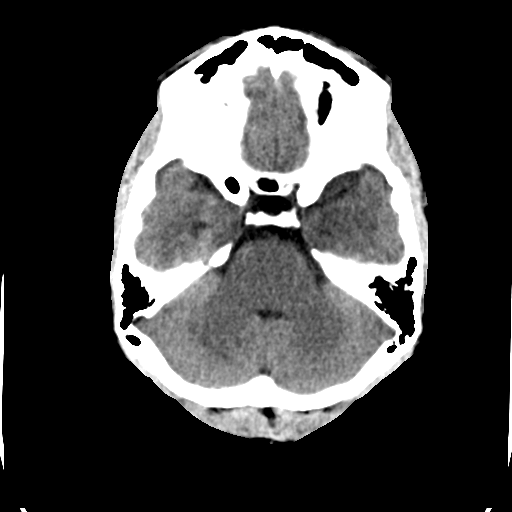
[im 13/33  brain]
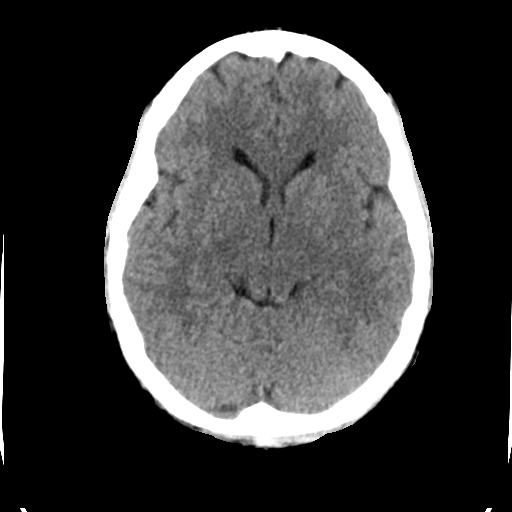
[im 17/33  brain]
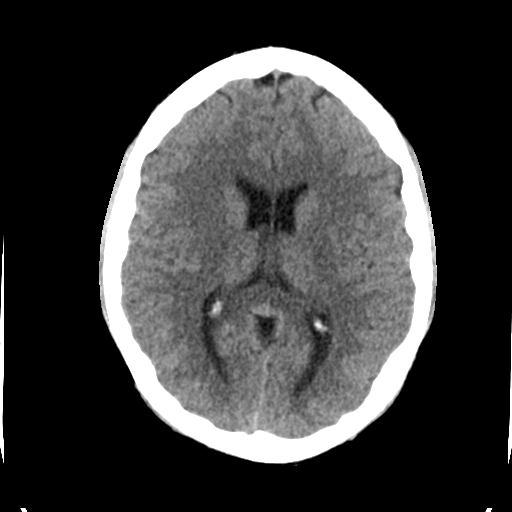
[im 21/33  brain]
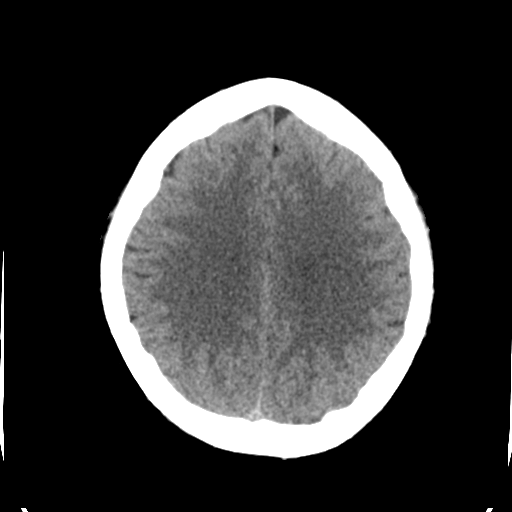
[im 21/33  bone]
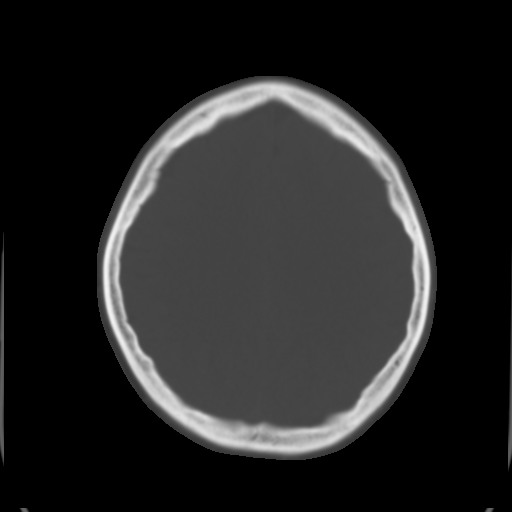
[im 25/33  brain]
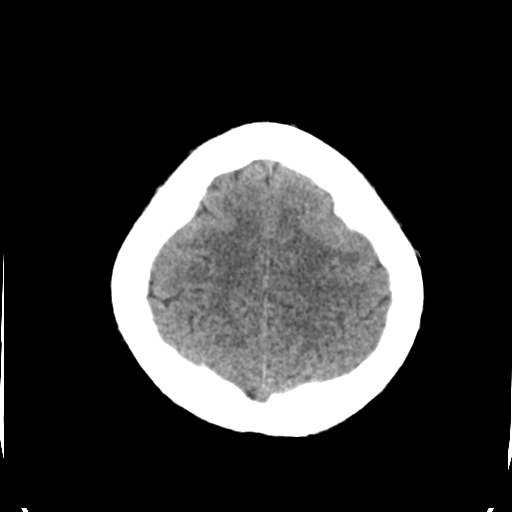
[im 29/33  brain]
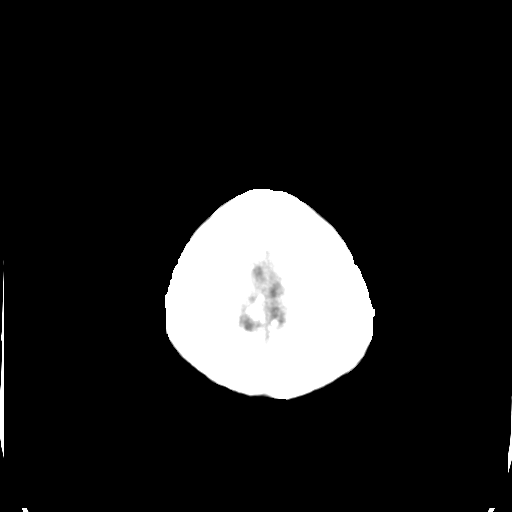

[Series 4: head bone · axial · 0.44mm/px · z∈[-58,-26]mm · 3 of 81 slices shown]
[im 9/81  bone]
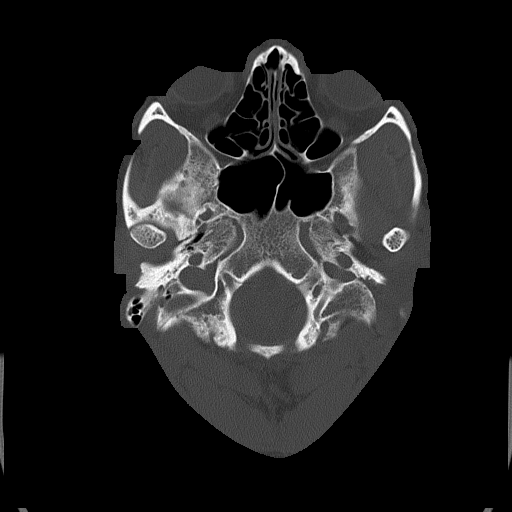
[im 17/81  bone]
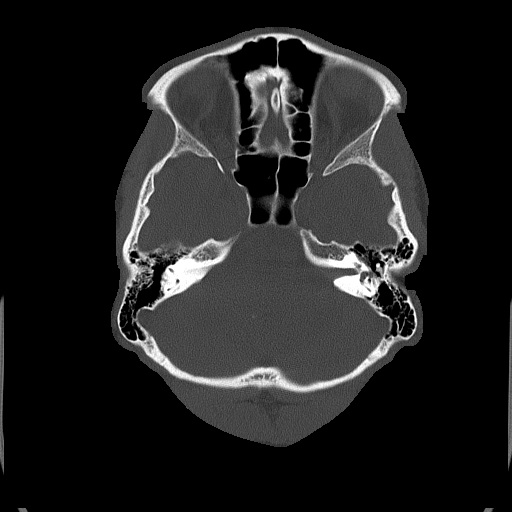
[im 25/81  bone]
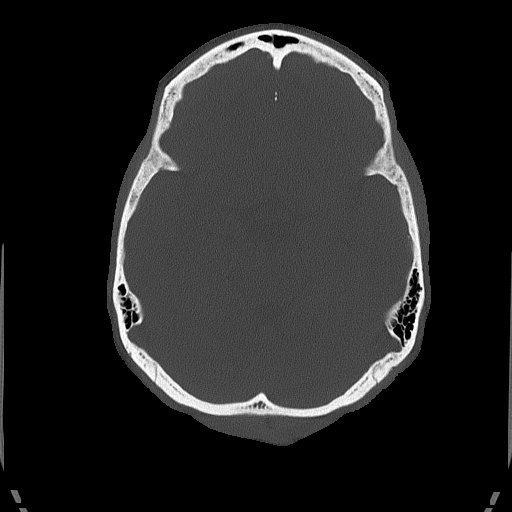

[Series 5: head without cor · coronal · non-contrast · 0.33mm/px · 3 of 70 slices shown]
[im 24/70  brain]
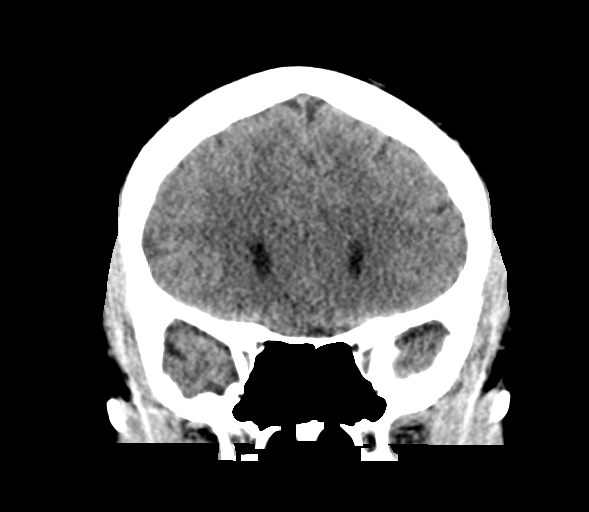
[im 31/70  brain]
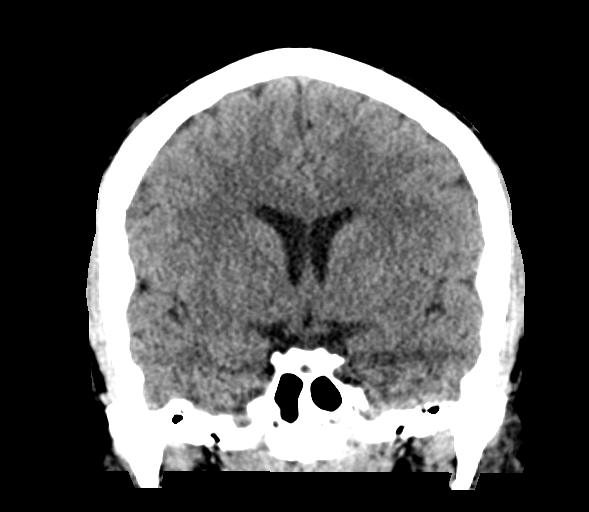
[im 39/70  brain]
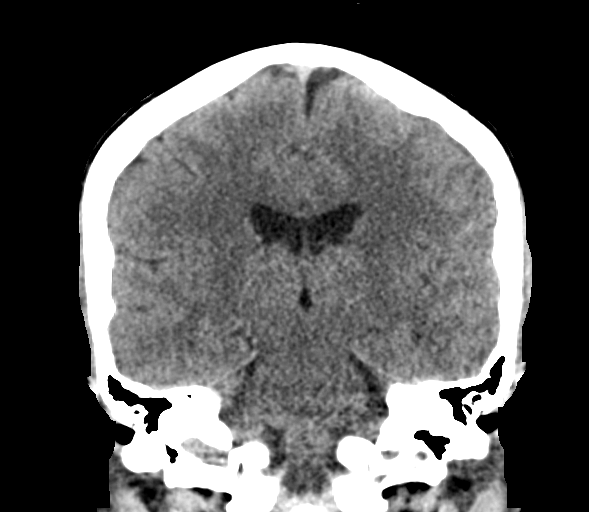

[Series 6: head without sag · sagittal · non-contrast · 0.35mm/px · 3 of 60 slices shown]
[im 20/60  brain]
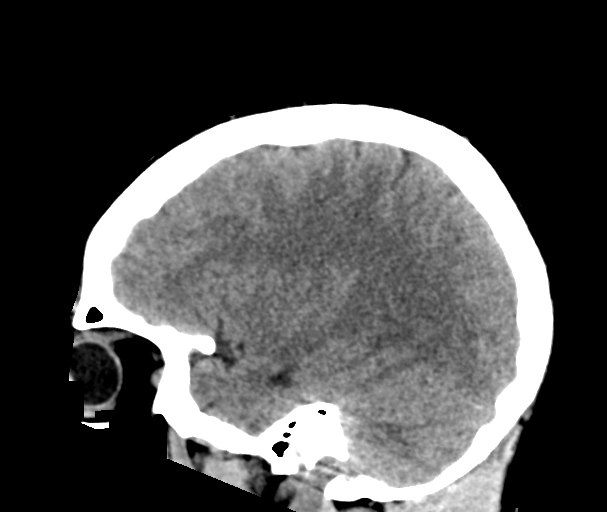
[im 30/60  brain]
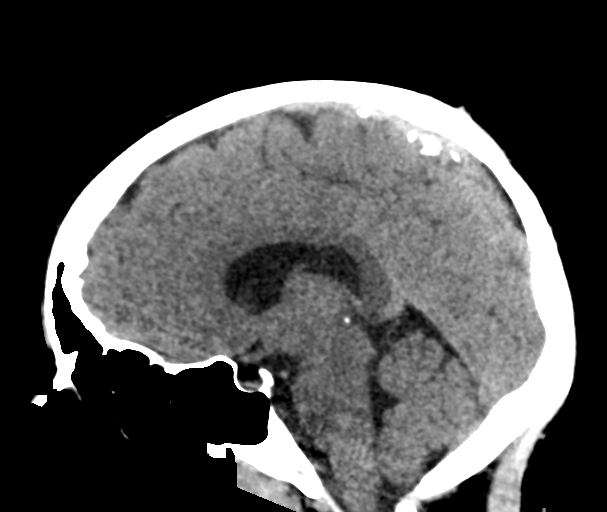
[im 40/60  brain]
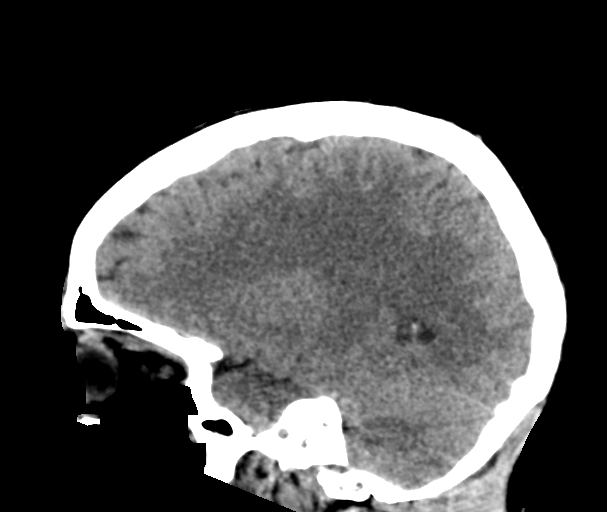

[16 of 47 positions shown; findings below may reference images not displayed]

FINDINGS: Brain: No evidence of acute infarction, hemorrhage, hydrocephalus,
extra-axial collection or mass lesion/mass effect.

The posterior fossa, including the cerebellum, brainstem and fourth
ventricle, is within normal limits. The third and lateral
ventricles, and basal ganglia are unremarkable in appearance. The
cerebral hemispheres are symmetric in appearance, with normal
gray-white differentiation. No mass effect or midline shift is seen.

Vascular: No hyperdense vessel or unexpected calcification.

Skull: There is no evidence of fracture; visualized osseous
structures are unremarkable in appearance.

Sinuses/Orbits: The orbits are within normal limits. The paranasal
sinuses and mastoid air cells are well-aerated.

Other: No significant soft tissue abnormalities are seen.
IMPRESSION: Unremarkable noncontrast CT of the head.

## 2018-02-21 ENCOUNTER — Emergency Department (HOSPITAL_COMMUNITY)
Admission: EM | Admit: 2018-02-21 | Discharge: 2018-02-21 | Disposition: A | Discharge status: Home / Self Care | Payer: Medicare Other | Attending: Emergency Medicine | Admitting: Emergency Medicine

## 2018-02-21 ENCOUNTER — Encounter (HOSPITAL_COMMUNITY): Payer: Self-pay | Admitting: *Deleted

## 2018-02-21 ENCOUNTER — Emergency Department (HOSPITAL_COMMUNITY): Payer: Medicare Other

## 2018-02-21 ENCOUNTER — Other Ambulatory Visit: Payer: Self-pay

## 2018-02-21 DIAGNOSIS — Z79899 Other long term (current) drug therapy: Secondary | ICD-10-CM | POA: Insufficient documentation

## 2018-02-21 DIAGNOSIS — Z992 Dependence on renal dialysis: Secondary | ICD-10-CM | POA: Diagnosis not present

## 2018-02-21 DIAGNOSIS — J069 Acute upper respiratory infection, unspecified: Secondary | ICD-10-CM

## 2018-02-21 DIAGNOSIS — R05 Cough: Secondary | ICD-10-CM | POA: Diagnosis present

## 2018-02-21 DIAGNOSIS — R059 Cough, unspecified: Secondary | ICD-10-CM

## 2018-02-21 DIAGNOSIS — N186 End stage renal disease: Secondary | ICD-10-CM | POA: Insufficient documentation

## 2018-02-21 DIAGNOSIS — I120 Hypertensive chronic kidney disease with stage 5 chronic kidney disease or end stage renal disease: Secondary | ICD-10-CM | POA: Diagnosis not present

## 2018-02-21 MED ORDER — DOXYCYCLINE HYCLATE 100 MG PO CAPS: 100.0000 mg | ORAL_CAPSULE | Freq: Two times a day (BID) | ORAL | 0 refills | Status: AC

## 2018-02-21 MED ORDER — BENZONATATE 100 MG PO CAPS: 100.0000 mg | ORAL_CAPSULE | Freq: Three times a day (TID) | ORAL | 0 refills | Status: AC | PRN

## 2018-02-21 NOTE — Discharge Instructions (Addendum)
Continue taking home medications as prescribed. Use Tessalon Perles as needed to prevent cough.  You may use cough drops in between. Make sure you are staying well-hydrated water. Take antibiotics as prescribed.  Take the entire course, even if your symptoms improve. Follow-up with your primary care doctor if symptoms are not improving. Return to the emergency room if you develop high fevers, difficulty breathing, or any new, worsening, concerning symptoms.

## 2018-02-21 NOTE — ED Triage Notes (Signed)
The pt has had a cough since Monday  Clear mucus  No temp

## 2018-02-21 NOTE — ED Provider Notes (Signed)
[facility] EMERGENCY DEPARTMENT Provider Note   CSN: [id number] Arrival date & time: 02/21/18  1556     History   Chief Complaint Chief Complaint  Patient presents with  . Cough    HPI Tricia Simmons is a 28 y.o. female presenting for evaluation of cough.  Patient states the past 5 days, she has been having a persistent cough.  It is productive of clear sputum.  She denies fevers, chills, sore throat, chest pain, shortness of breath, nausea, abdominal pain, urinary symptoms, abnormal bowel movements.  Patient states she coughed so hard that she throws up.  She denies nausea or vomiting in between coughing fits.  She has been using Mucinex without improvement of her symptoms.  She has not tried anything else.  She has a history of ESRD on dialysis and hypertension.  She goes to dialysis Monday, Wednesday, Friday, went today without abnormalities.  She denies sick contacts.  She did not get her flu shot this year.  HPI  Past Medical History:  Diagnosis Date  . ESRD (end stage renal disease) ([place])    M-W-F  . Hypertension   . Renal disorder     Patient Active Problem List   Diagnosis Date Noted  . ESRD (end stage renal disease) ([place]) 03/18/2017  . ESRD on dialysis [facility]) 04/26/2016    Past Surgical History:  Procedure Laterality Date  . A/V FISTULAGRAM Left 04/26/2016   Procedure: A/V Fistulagram;  Surgeon: Conrad Frostproof, MD;  Location: [facility];  Service: Cardiovascular;  Laterality: Left;  . DG AV DIALYSIS  SHUNT ACCESS EXIST*L* OR Left 11/13/2014  . INTRAOPERATIVE ARTERIOGRAM Left 03/08/2017   Procedure: INTRA OPERATIVE ARTERIOGRAM;  Surgeon: Angelia Mould, MD;  Location: [facility];  Service: Vascular;  Laterality: Left;  . PATCH ANGIOPLASTY Left 03/08/2017   Procedure: PATCH ANGIOPLASTY LEFT ARTERIOVENOUS FISTULA TIMES TWO;  Surgeon: Angelia Mould, MD;  Location: [facility];  Service: Vascular;  Laterality: Left;  . PERIPHERAL VASCULAR  BALLOON ANGIOPLASTY Left 04/26/2016   Procedure: Peripheral Vascular Balloon Angioplasty;  Surgeon: Conrad Montpelier, MD;  Location: [facility];  Service: Cardiovascular;  Laterality: Left;  Arm Fistula  . THROMBECTOMY W/ EMBOLECTOMY Left 03/08/2017   Procedure: THROMBECTOMY ARTERIOVENOUS FISTULA;  Surgeon: Angelia Mould, MD;  Location: [facility] OR;  Service: Vascular;  Laterality: Left;     OB History    Gravida  1   Para      Term      Preterm      AB      Living  1     SAB      TAB      Ectopic      Multiple      Live Births               Home Medications    Prior to Admission medications   Medication Sig Start Date End Date Taking? Authorizing Provider  amLODipine (NORVASC) 10 MG tablet Take 10 mg by mouth at bedtime. 09/13/15   [provider]  benzonatate (TESSALON) 100 MG capsule Take 1 capsule (100 mg total) by mouth 3 (three) times daily as needed for cough. 02/21/18   Zayne Draheim, PA-C  calcium acetate (PHOSLO) 667 MG capsule Take 667-1,334 mg by mouth See admin instructions. 2 capsules before a meal and 1 capsule before a snack - up to 9 capsules daily    [provider]  doxycycline (VIBRAMYCIN) 100  MG capsule Take 1 capsule (100 mg total) by mouth 2 (two) times daily for 7 days. 02/21/18 02/28/18  Wallace Cogliano, PA-C  labetalol (NORMODYNE) 200 MG tablet Take 400 mg by mouth 2 (two) times daily.     [provider]  lisinopril (PRINIVIL,ZESTRIL) 20 MG tablet Take 20 mg by mouth at bedtime. 10/20/15   [provider]  metoCLOPramide (REGLAN) 10 MG tablet Take 1 tablet (10 mg total) by mouth every 6 (six) hours as needed for nausea (or headache). 5/0/09   Delora Fuel, MD  oxyCODONE-acetaminophen (PERCOCET) 5-325 MG tablet Take 1 tablet by mouth every 4 (four) hours as needed for moderate pain or severe pain. 04/19/16   Delora Fuel, MD  SENSIPAR 60 MG tablet Take 60 mg by mouth daily after supper. 10/21/15   [provider]    Family History No family history on file.  Social History Social History   Tobacco Use  . Smoking status: Never Smoker  . Smokeless tobacco: Never Used  Substance Use Topics  . Alcohol use: No  . Drug use: No     Allergies   Bee venom and Ibuprofen   Review of Systems Review of Systems  Respiratory: Positive for cough.   Gastrointestinal: Positive for vomiting (Posttussive).  All other systems reviewed and are negative.    Physical Exam Updated Vital Signs BP (!) 124/92   Pulse 81   Temp 99 F (37.2 C)   Resp 16   Ht 5\' 5"  (1.651 m)   Wt 72 kg   LMP 01/13/2018   SpO2 100%   BMI 26.41 kg/m   Physical Exam Vitals signs and nursing note reviewed.  Constitutional:      General: She is not in acute distress.    Appearance: She is well-developed.     Comments: Sitting comfortably in the bed in no acute distress  HENT:     Head: Normocephalic and atraumatic.     Comments: OP clear for tonsillar swelling exudate.  Uvula midline with palate rise.  TMs nonerythematous nonbulging bilaterally.    Right Ear: Tympanic membrane, ear canal and external ear normal.     Left Ear: Tympanic membrane, ear canal and external ear normal.     Nose: Mucosal edema present.     Right Sinus: No maxillary sinus tenderness or frontal sinus tenderness.     Left Sinus: No maxillary sinus tenderness or frontal sinus tenderness.     Mouth/Throat:     Pharynx: Uvula midline.     Tonsils: No tonsillar exudate.  Eyes:     Extraocular Movements: Extraocular movements intact.     Conjunctiva/sclera: Conjunctivae normal.     Pupils: Pupils are equal, round, and reactive to light.  Neck:     Musculoskeletal: Normal range of motion.  Cardiovascular:     Rate and Rhythm: Normal rate and regular rhythm.     Pulses: Normal pulses.  Pulmonary:     Effort: Pulmonary effort is normal.     Breath sounds: Normal breath sounds. No decreased breath sounds, wheezing, rhonchi or  rales.     Comments: Speaking in full sentences.  Clear lung sounds in all fields.  No signs of respiratory distress. Abdominal:     General: There is no distension.     Palpations: Abdomen is soft.     Tenderness: There is no abdominal tenderness.  Musculoskeletal: Normal range of motion.     Comments: No leg pain or swelling  Lymphadenopathy:  Cervical: No cervical adenopathy.  Skin:    General: Skin is warm.     Capillary Refill: Capillary refill takes less than 2 seconds.  Neurological:     Mental Status: She is alert and oriented to person, place, and time.      ED Treatments / Results  Labs (all labs ordered are listed, but only abnormal results are displayed) Labs Reviewed - No data to display  EKG None  Radiology Dg Chest 2 View  Result Date: 02/21/2018 CLINICAL DATA:  Cough. EXAM: CHEST - 2 VIEW COMPARISON:  03/18/2017 FINDINGS: The heart size and mediastinal contours are within normal limits. There is no evidence of pulmonary edema, consolidation, pneumothorax, nodule or pleural fluid. The visualized skeletal structures are unremarkable. IMPRESSION: No active cardiopulmonary disease. Electronically Signed   By: Aletta Edouard M.D.   On: 02/21/2018 17:11    Procedures Procedures (including critical care time)  Medications Ordered in ED Medications - No data to display   Initial Impression / Assessment and Plan / ED Course  I have reviewed the triage vital signs and the nursing notes.  Pertinent labs & imaging results that were available during my care of the patient were reviewed by me and considered in my medical decision making (see chart for details).     Resenting for evaluation of cough.  Physical examination, she is afebrile not tachycardic.  Appears nontoxic.  Will obtain chest x-ray considering history of ESRD.  No nausea or vomiting outside of posttussive emesis.  As such, low suspicion for intra-abdominal pathology.  Patient appears well-hydrated,  and without abdominal pain.  No flatus abnormalities necessary at this time.  X-ray viewed interpreted by me, no pneumonia, intoxication, cardiomegaly.  However, considering ESRD and again compromised state, will cover with antibiotics.  Low suspicion for flu, no fevers or body aches.  As she is 5 days and, I do not believe flu testing is necessary at this time.  Low suspicion for ACS or PE.  Likely viral illness.  Discussed cough control including Tessalon Perles, cough drops, use of honey.  Encouraged hydration.  Encourage follow-up PCP as needed if symptoms are not improving. Case discussed with attending, Dr. Sedonia Small evaluated the pt. At this time, pt appears safe for d/c. Return precautions given. Pt states she understands and agrees to plan.    Final Clinical Impressions(s) / ED Diagnoses   Final diagnoses:  Cough  Upper respiratory tract infection, unspecified type    ED Discharge Orders         Ordered    doxycycline (VIBRAMYCIN) 100 MG capsule  2 times daily     02/21/18 1753    benzonatate (TESSALON) 100 MG capsule  3 times daily PRN     02/21/18 1753           Franchot Heidelberg, PA-C 02/21/18 1800    Maudie Flakes, MD 02/22/18 1253

## 2019-01-12 IMAGING — DX DG CHEST 2V
2 series · 2 of 2 positions shown · non-contrast
Comparison: 03/18/2017

CLINICAL DATA: Cough.

EXAM:
CHEST - 2 VIEW

[chest pa]
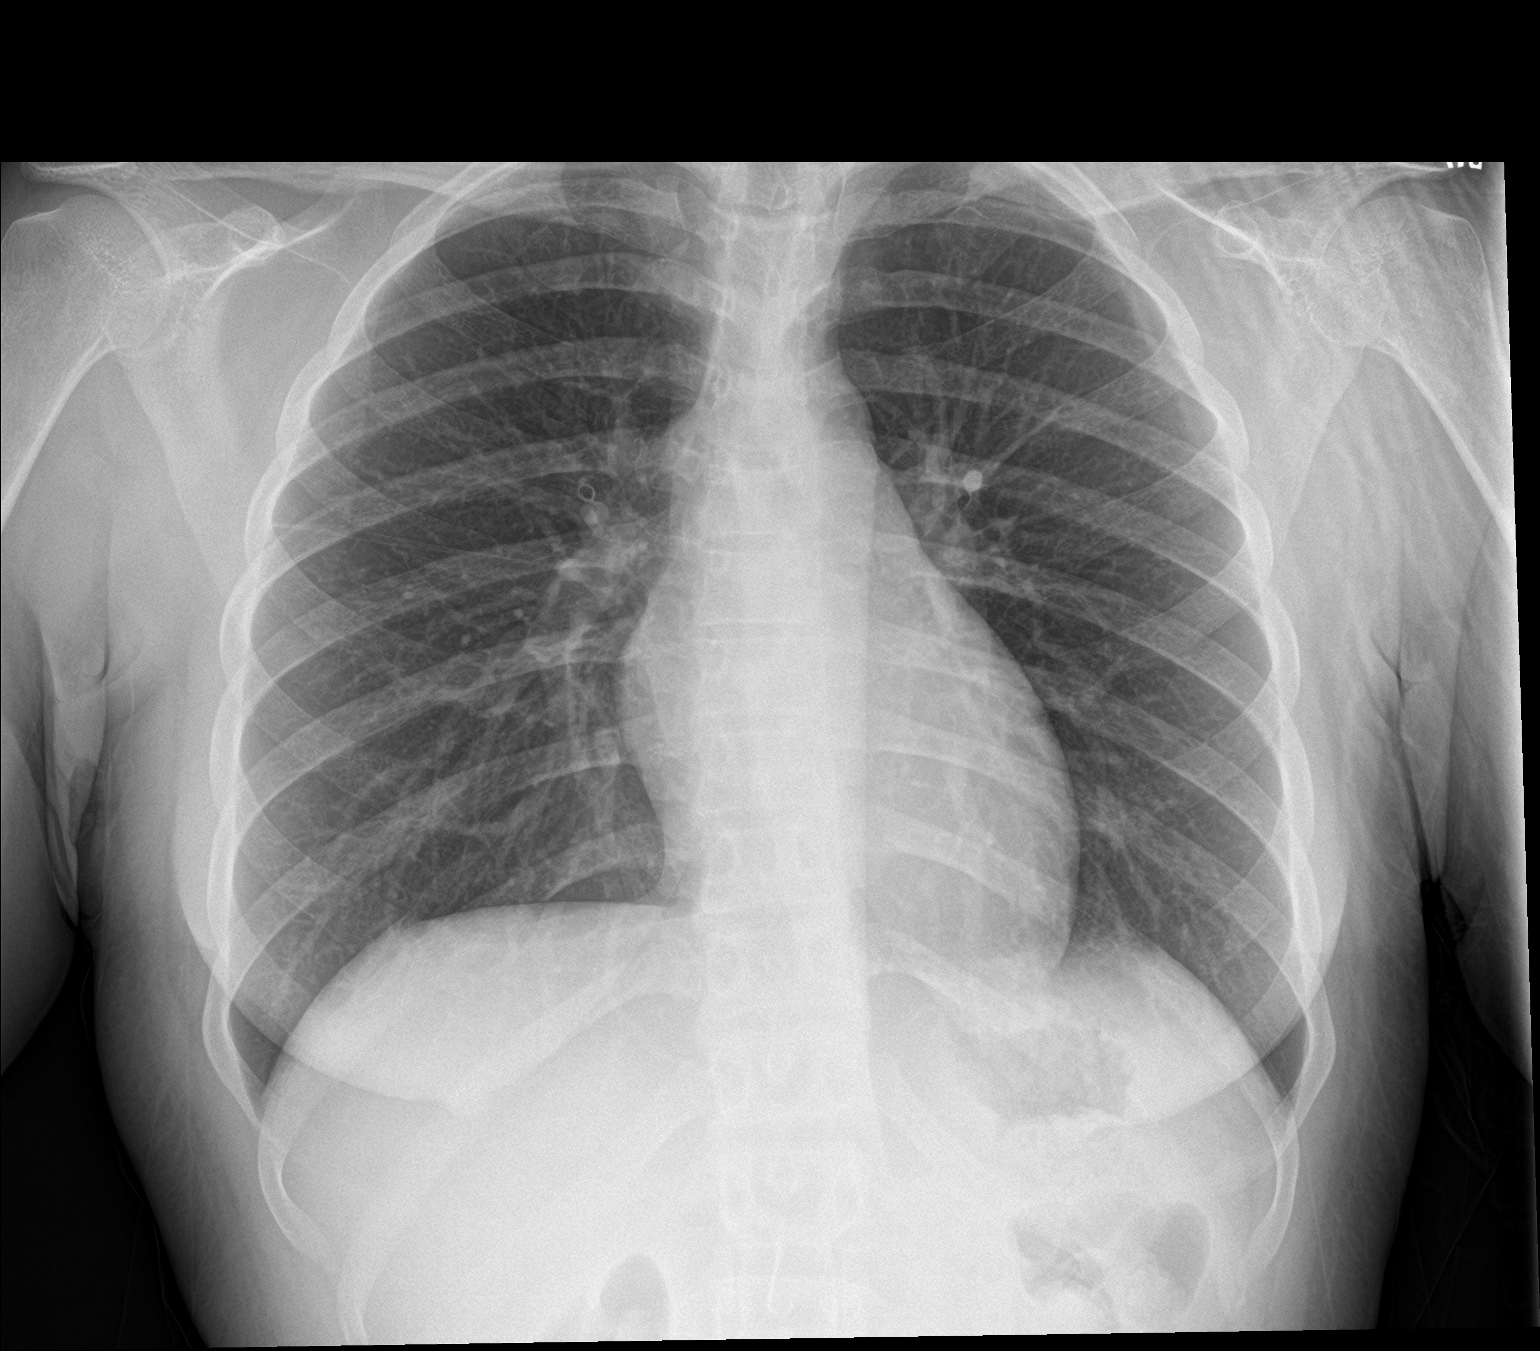

[chest lat]
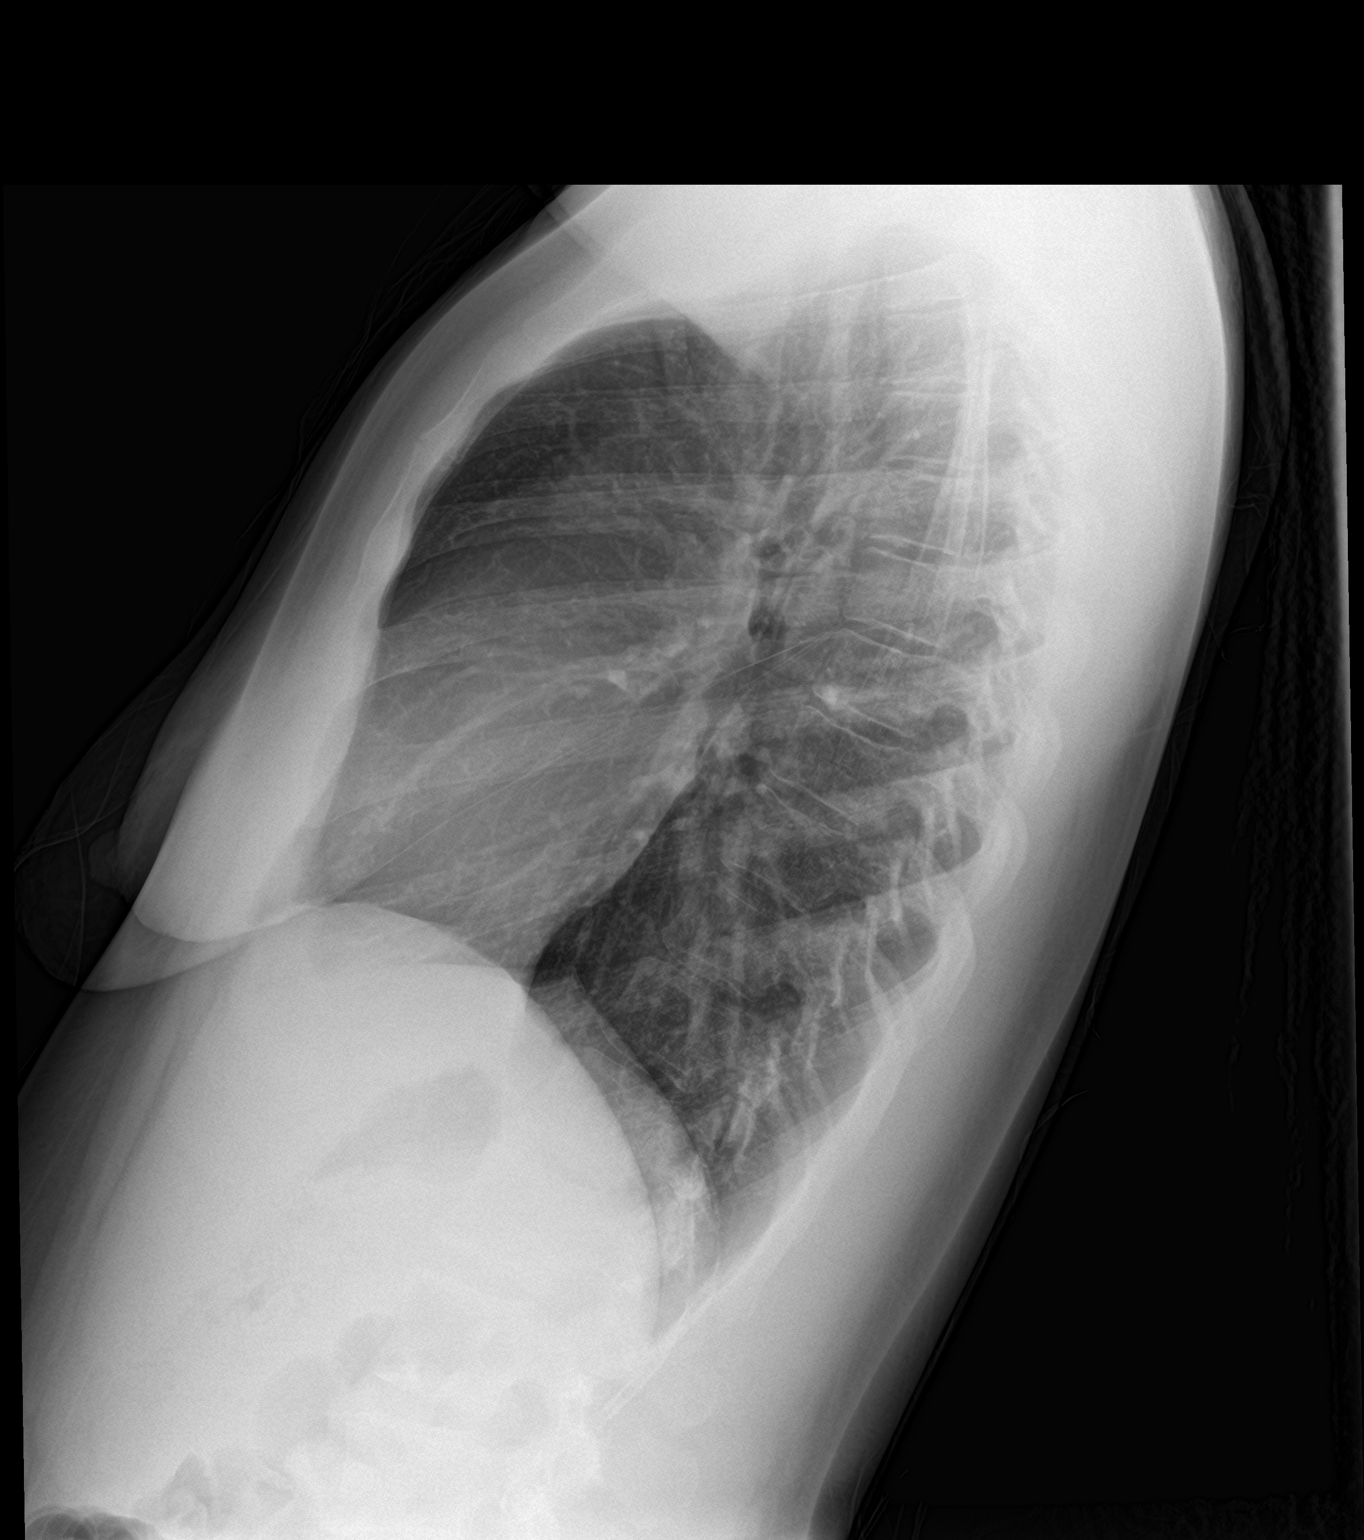

[2 of 2 positions shown; findings below may reference images not displayed]

FINDINGS: The heart size and mediastinal contours are within normal limits.
There is no evidence of pulmonary edema, consolidation,
pneumothorax, nodule or pleural fluid. The visualized skeletal
structures are unremarkable.
IMPRESSION: No active cardiopulmonary disease.
